# Patient Record
Sex: Male | Born: 1955 | Race: White | Hispanic: No | Marital: Single | Smoking: Never smoker
Health system: Southern US, Community
[De-identification: ages and names within clinical notes are randomized; demographics above are authoritative.]

## PROBLEM LIST (undated history)

## (undated) DIAGNOSIS — R0683 Snoring: Secondary | ICD-10-CM

## (undated) DIAGNOSIS — M1990 Unspecified osteoarthritis, unspecified site: Secondary | ICD-10-CM

## (undated) DIAGNOSIS — IMO0002 Reserved for concepts with insufficient information to code with codable children: Secondary | ICD-10-CM

## (undated) DIAGNOSIS — M419 Scoliosis, unspecified: Secondary | ICD-10-CM

## (undated) DIAGNOSIS — F909 Attention-deficit hyperactivity disorder, unspecified type: Secondary | ICD-10-CM

## (undated) DIAGNOSIS — C801 Malignant (primary) neoplasm, unspecified: Secondary | ICD-10-CM

## (undated) HISTORY — PX: OTHER SURGICAL HISTORY: SHX169

## (undated) HISTORY — PX: COSMETIC SURGERY: SHX468

## (undated) HISTORY — DX: Snoring: R06.83

## (undated) HISTORY — PX: MOHS SURGERY: SUR867

---

## 1987-08-15 HISTORY — PX: TMJ ARTHROPLASTY: SHX1066

## 2000-08-14 HISTORY — PX: OTHER SURGICAL HISTORY: SHX169

## 2000-10-24 ENCOUNTER — Encounter: Payer: Self-pay | Admitting: Orthopedic Surgery

## 2000-10-24 ENCOUNTER — Ambulatory Visit (HOSPITAL_COMMUNITY): Admission: RE | Admit: 2000-10-24 | Discharge: 2000-10-24 | Payer: Self-pay | Admitting: Orthopedic Surgery

## 2004-12-31 ENCOUNTER — Emergency Department (HOSPITAL_COMMUNITY): Admission: EM | Admit: 2004-12-31 | Discharge: 2004-12-31 | Payer: Self-pay | Admitting: Emergency Medicine

## 2011-08-26 ENCOUNTER — Emergency Department (INDEPENDENT_AMBULATORY_CARE_PROVIDER_SITE_OTHER): Payer: 59

## 2011-08-26 ENCOUNTER — Encounter (HOSPITAL_BASED_OUTPATIENT_CLINIC_OR_DEPARTMENT_OTHER): Payer: Self-pay | Admitting: *Deleted

## 2011-08-26 ENCOUNTER — Emergency Department (HOSPITAL_BASED_OUTPATIENT_CLINIC_OR_DEPARTMENT_OTHER)
Admission: EM | Admit: 2011-08-26 | Discharge: 2011-08-26 | Disposition: A | Discharge status: Home / Self Care | Payer: 59 | Attending: Emergency Medicine | Admitting: Emergency Medicine

## 2011-08-26 DIAGNOSIS — S52123A Displaced fracture of head of unspecified radius, initial encounter for closed fracture: Secondary | ICD-10-CM

## 2011-08-26 DIAGNOSIS — S52122A Displaced fracture of head of left radius, initial encounter for closed fracture: Secondary | ICD-10-CM

## 2011-08-26 DIAGNOSIS — W19XXXA Unspecified fall, initial encounter: Secondary | ICD-10-CM

## 2011-08-26 DIAGNOSIS — IMO0002 Reserved for concepts with insufficient information to code with codable children: Secondary | ICD-10-CM | POA: Insufficient documentation

## 2011-08-26 HISTORY — DX: Reserved for concepts with insufficient information to code with codable children: IMO0002

## 2011-08-26 MED ORDER — HYDROCODONE-ACETAMINOPHEN 5-325 MG PO TABS: 2.0000 | ORAL_TABLET | ORAL | Status: AC | PRN

## 2011-08-26 MED ORDER — HYDROCODONE-ACETAMINOPHEN 5-325 MG PO TABS
2.0000 | ORAL_TABLET | Freq: Once | ORAL | Status: AC
  Administered 2011-08-26: 2 via ORAL
  Filled 2011-08-26: qty 2
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
## 2011-08-26 NOTE — ED Provider Notes (Signed)
History     CSN: [id number]  Arrival date & time 08/26/11  1415   None     Chief Complaint  Patient presents with  . Arm Injury    (Consider location/radiation/quality/duration/timing/severity/associated sxs/prior treatment) HPI  Past Medical History  Diagnosis Date  . DDD (degenerative disc disease)     Past Surgical History  Procedure Date  . Tmj arthroplasty   . Knee surgey   . Mohs surgery     No family history on file.  History  Substance Use Topics  . Smoking status: Never Smoker   . Smokeless tobacco: Never Used  . Alcohol Use: 1.8 oz/week    3 Cans of beer per week      Review of Systems  Allergies  Review of patient's allergies indicates no known allergies.  Home Medications   Current Outpatient Rx  Name Route Sig Dispense Refill  . OVER THE COUNTER MEDICATION Both Eyes Place 1 drop into both eyes 3 (three) times daily as needed. Eye drop for irritation    . GUMMI BEAR MULTIVITAMIN/MIN PO Oral Take 2 each by mouth daily.      BP 124/89  Pulse 72  Temp 98.4 F (36.9 C)  Resp 20  Ht 5\' 8"  (1.727 m)  Wt 175 lb (79.379 kg)  BMI 26.61 kg/m2  SpO2 97%  Physical Exam  ED Course  Procedures (including critical care time)  Labs Reviewed - No data to display No results found.   No diagnosis found.    MDM  Duplicate note        Doug Sou, MD 08/27/11 [phone]

## 2012-03-28 ENCOUNTER — Encounter: Payer: Self-pay | Admitting: Internal Medicine

## 2012-05-07 ENCOUNTER — Encounter: Payer: Self-pay | Admitting: Internal Medicine

## 2012-05-07 ENCOUNTER — Ambulatory Visit (AMBULATORY_SURGERY_CENTER): Payer: 59

## 2012-05-07 VITALS — Ht 67.5 in | Wt 185.7 lb

## 2012-05-07 DIAGNOSIS — Z800 Family history of malignant neoplasm of digestive organs: Secondary | ICD-10-CM

## 2012-05-07 DIAGNOSIS — Z1211 Encounter for screening for malignant neoplasm of colon: Secondary | ICD-10-CM

## 2012-05-07 MED ORDER — MOVIPREP 100 G PO SOLR: ORAL | Status: DC

## 2012-05-21 ENCOUNTER — Encounter: Payer: 59 | Admitting: Internal Medicine

## 2012-06-04 ENCOUNTER — Ambulatory Visit (AMBULATORY_SURGERY_CENTER): Payer: 59 | Admitting: Internal Medicine

## 2012-06-04 ENCOUNTER — Encounter: Payer: Self-pay | Admitting: Internal Medicine

## 2012-06-04 VITALS — BP 102/67 | HR 59 | Temp 96.6°F | Resp 20 | Ht 67.5 in | Wt 185.0 lb

## 2012-06-04 DIAGNOSIS — D126 Benign neoplasm of colon, unspecified: Secondary | ICD-10-CM

## 2012-06-04 DIAGNOSIS — Z1211 Encounter for screening for malignant neoplasm of colon: Secondary | ICD-10-CM

## 2012-06-04 DIAGNOSIS — Z800 Family history of malignant neoplasm of digestive organs: Secondary | ICD-10-CM

## 2012-06-04 MED ORDER — SODIUM CHLORIDE 0.9 % IV SOLN: 500.0000 mL | INTRAVENOUS | Status: DC

## 2012-06-04 NOTE — Op Note (Signed)
[facility] [address] [place], [address]   COLONOSCOPY PROCEDURE REPORT  PATIENT: Mitchell Beasley, Mitchell Beasley  MR#: [id number] BIRTHDATE: Apr 08, 1956 , 56  yrs. old GENDER: Male ENDOSCOPIST: Roxy Cedar, MD REFERRED [id number] Avva, M.D. PROCEDURE DATE:  06/04/2012 PROCEDURE:   Colonoscopy with snare polypectomy    x 1 ASA CLASS:   Class I INDICATIONS:average risk screening (grandparent CRC 90). MEDICATIONS: MAC sedation, administered by CRNA and propofol (Diprivan) 220mg  IV  DESCRIPTION OF PROCEDURE:   After the risks benefits and alternatives of the procedure were thoroughly explained, informed consent was obtained.  A digital rectal exam revealed no abnormalities of the rectum.   The LB CF-[id number]  endoscope was introduced through the anus and advanced to the cecum, which was identified by both the appendix and ileocecal valve. No adverse events experienced.   The quality of the prep was good, using MoviPrep  The instrument was then slowly withdrawn as the colon was fully examined.      COLON FINDINGS: A single polyp measuring 5 mm in size was found at the cecum.  A polypectomy was performed with a cold snare.  The resection was complete and the polyp tissue was completely retrieved.   The colon was otherwise normal.  There was no diverticulosis, inflammation, other polyps or cancers . Retroflexed views revealed small internal hemorrhoids. The time to cecum=2 minutes 57 seconds.  Withdrawal time=14 minutes 16 seconds. The scope was withdrawn and the procedure completed. COMPLICATIONS: There were no complications.  ENDOSCOPIC IMPRESSION: 1.   Single polyp measuring 5 mm in size was found at the cecum; polypectomy was performed with a cold snare 2.   The colon was otherwise normal  RECOMMENDATIONS: 1. Repeat colonoscopy in 5 years if polyp adenomatous; otherwise 10 years   eSigned:  Roxy Cedar, MD 06/04/2012 10:24 AM   cc: Chilton Greathouse, MD and Jarome Matin, MD

## 2012-06-04 NOTE — Patient Instructions (Addendum)
Handout was given to your care partner on polyps.  You may resume your current medications today.  Please call if any questions or concerns.    YOU HAD AN ENDOSCOPIC PROCEDURE TODAY AT THE [facility]: Refer to the procedure report that was given to you for any specific questions about what was found during the examination.  If the procedure report does not answer your questions, please call your gastroenterologist to clarify.  If you requested that your care partner not be given the details of your procedure findings, then the procedure report has been included in a sealed envelope for you to review at your convenience later.  YOU SHOULD EXPECT: Some feelings of bloating in the abdomen. Passage of more gas than usual.  Walking can help get rid of the air that was put into your GI tract during the procedure and reduce the bloating. If you had a lower endoscopy (such as a colonoscopy or flexible sigmoidoscopy) you may notice spotting of blood in your stool or on the toilet paper. If you underwent a bowel prep for your procedure, then you may not have a normal bowel movement for a few days.  DIET: Your first meal following the procedure should be a light meal and then it is ok to progress to your normal diet.  A half-sandwich or bowl of soup is an example of a good first meal.  Heavy or fried foods are harder to digest and may make you feel nauseous or bloated.  Likewise meals heavy in dairy and vegetables can cause extra gas to form and this can also increase the bloating.  Drink plenty of fluids but you should avoid alcoholic beverages for 24 hours.  ACTIVITY: Your care partner should take you home directly after the procedure.  You should plan to take it easy, moving slowly for the rest of the day.  You can resume normal activity the day after the procedure however you should NOT DRIVE or use heavy machinery for 24 hours (because of the sedation medicines used during the test).    SYMPTOMS  TO REPORT IMMEDIATELY: A gastroenterologist can be reached at any hour.  During normal business hours, 8:30 AM to 5:00 PM Monday through Friday, call [phone].  After hours and on weekends, please call the GI answering service at [phone] who will take a message and have the physician on call contact you.   Following lower endoscopy (colonoscopy or flexible sigmoidoscopy):  Excessive amounts of blood in the stool  Significant tenderness or worsening of abdominal pains  Swelling of the abdomen that is new, acute  Fever of 100F or higher    FOLLOW UP: If any biopsies were taken you will be contacted by phone or by letter within the next 1-3 weeks.  Call your gastroenterologist if you have not heard about the biopsies in 3 weeks.  Our staff will call the home number listed on your records the next business day following your procedure to check on you and address any questions or concerns that you may have at that time regarding the information given to you following your procedure. This is a courtesy call and so if there is no answer at the home number and we have not heard from you through the emergency physician on call, we will assume that you have returned to your regular daily activities without incident.  SIGNATURES/CONFIDENTIALITY: You and/or your care partner have signed paperwork which will be entered into your electronic medical record.    These signatures attest to the fact that that the information above on your After Visit Summary has been reviewed and is understood.  Full responsibility of the confidentiality of this discharge information lies with you and/or your care-partner. 
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
## 2012-06-05 ENCOUNTER — Telehealth: Payer: Self-pay | Admitting: *Deleted

## 2012-06-05 NOTE — Telephone Encounter (Signed)
Left message on number given in admitting yesterday as directed per pt. ewm 

## 2012-06-10 ENCOUNTER — Encounter: Payer: Self-pay | Admitting: Internal Medicine

## 2012-11-19 ENCOUNTER — Other Ambulatory Visit: Payer: Self-pay | Admitting: Dermatology

## 2012-12-10 ENCOUNTER — Other Ambulatory Visit: Payer: Self-pay | Admitting: Dermatology

## 2013-06-03 ENCOUNTER — Other Ambulatory Visit: Payer: Self-pay | Admitting: Dermatology

## 2013-08-01 ENCOUNTER — Other Ambulatory Visit (HOSPITAL_COMMUNITY): Payer: Self-pay | Admitting: Unknown Physician Specialty

## 2013-08-01 DIAGNOSIS — H539 Unspecified visual disturbance: Secondary | ICD-10-CM

## 2014-07-13 ENCOUNTER — Other Ambulatory Visit: Payer: Self-pay | Admitting: Internal Medicine

## 2014-07-13 DIAGNOSIS — M419 Scoliosis, unspecified: Secondary | ICD-10-CM

## 2014-07-13 DIAGNOSIS — M48061 Spinal stenosis, lumbar region without neurogenic claudication: Secondary | ICD-10-CM

## 2014-07-14 ENCOUNTER — Ambulatory Visit
Admission: RE | Admit: 2014-07-14 | Discharge: 2014-07-14 | Disposition: A | Discharge status: Home / Self Care | Payer: BC Managed Care – PPO | Source: Ambulatory Visit | Attending: Internal Medicine | Admitting: Internal Medicine

## 2014-07-14 DIAGNOSIS — M419 Scoliosis, unspecified: Secondary | ICD-10-CM

## 2014-07-14 DIAGNOSIS — M48061 Spinal stenosis, lumbar region without neurogenic claudication: Secondary | ICD-10-CM

## 2014-07-21 ENCOUNTER — Other Ambulatory Visit: Payer: 59

## 2014-10-13 DIAGNOSIS — M4120 Other idiopathic scoliosis, site unspecified: Secondary | ICD-10-CM | POA: Insufficient documentation

## 2014-12-08 ENCOUNTER — Other Ambulatory Visit: Payer: Self-pay | Admitting: Dermatology

## 2015-03-15 ENCOUNTER — Other Ambulatory Visit: Payer: Self-pay | Admitting: Internal Medicine

## 2015-03-15 DIAGNOSIS — M25552 Pain in left hip: Secondary | ICD-10-CM

## 2015-04-06 ENCOUNTER — Other Ambulatory Visit: Payer: Self-pay

## 2015-04-06 ENCOUNTER — Ambulatory Visit
Admission: RE | Admit: 2015-04-06 | Discharge: 2015-04-06 | Disposition: A | Discharge status: Home / Self Care | Payer: BLUE CROSS/BLUE SHIELD | Source: Ambulatory Visit | Attending: Internal Medicine | Admitting: Internal Medicine

## 2015-04-06 DIAGNOSIS — M25552 Pain in left hip: Secondary | ICD-10-CM

## 2015-04-06 MED ORDER — METHYLPREDNISOLONE ACETATE 40 MG/ML INJ SUSP (RADIOLOG
120.0000 mg | Freq: Once | INTRAMUSCULAR | Status: AC
  Administered 2015-04-06: 120 mg via INTRA_ARTICULAR

## 2015-04-06 MED ORDER — IOHEXOL 180 MG/ML  SOLN
1.0000 mL | Freq: Once | INTRAMUSCULAR | Status: DC | PRN
  Administered 2015-04-06: 1 mL via INTRA_ARTICULAR

## 2015-04-06 NOTE — Discharge Instructions (Signed)

## 2015-06-15 DIAGNOSIS — C801 Malignant (primary) neoplasm, unspecified: Secondary | ICD-10-CM

## 2015-06-15 HISTORY — DX: Malignant (primary) neoplasm, unspecified: C80.1

## 2015-06-25 ENCOUNTER — Other Ambulatory Visit: Payer: Self-pay | Admitting: Internal Medicine

## 2015-06-25 DIAGNOSIS — M25552 Pain in left hip: Secondary | ICD-10-CM

## 2015-06-30 ENCOUNTER — Other Ambulatory Visit: Payer: BLUE CROSS/BLUE SHIELD

## 2015-07-06 ENCOUNTER — Ambulatory Visit
Admission: RE | Admit: 2015-07-06 | Discharge: 2015-07-06 | Disposition: A | Discharge status: Home / Self Care | Payer: BLUE CROSS/BLUE SHIELD | Source: Ambulatory Visit | Attending: Internal Medicine | Admitting: Internal Medicine

## 2015-07-06 DIAGNOSIS — M25552 Pain in left hip: Secondary | ICD-10-CM

## 2015-07-06 MED ORDER — IOHEXOL 180 MG/ML  SOLN
1.0000 mL | Freq: Once | INTRAMUSCULAR | Status: DC | PRN
  Administered 2015-07-06: 1 mL via INTRA_ARTICULAR

## 2015-07-06 MED ORDER — METHYLPREDNISOLONE ACETATE 40 MG/ML INJ SUSP (RADIOLOG
120.0000 mg | Freq: Once | INTRAMUSCULAR | Status: AC
  Administered 2015-07-06: 120 mg via INTRA_ARTICULAR

## 2015-07-15 ENCOUNTER — Ambulatory Visit: Payer: Self-pay | Admitting: Orthopedic Surgery

## 2015-07-15 NOTE — Progress Notes (Signed)
Preoperative surgical orders have been place into the Epic hospital system for Mitchell Beasley on 07/15/2015, 12:19 PM  by Mickel Crow for surgery on 08-18-2015.  Preop Total Hip - Anterior Approach orders including IV Tylenol, and IV Decadron as long as there are no contraindications to the above medications. Arlee Muslim, PA-C

## 2015-08-03 ENCOUNTER — Other Ambulatory Visit (HOSPITAL_COMMUNITY): Payer: Self-pay | Admitting: *Deleted

## 2015-08-03 NOTE — Patient Instructions (Signed)
Mitchell Beasley  08/03/2015   Your procedure is scheduled on: 08-17-14  Report to [facility]  Entrance take [facility]  elevators to 3rd floor to  Mitchell Beasley at 630 AM.  Call this number if you have problems the morning of surgery [phone]   Remember: ONLY 1 PERSON MAY GO WITH YOU TO SHORT STAY TO GET  READY MORNING OF [name].  Do not eat food or drink liquids :After Midnight.     Take these medicines the morning of surgery with A SIP OF WATER: Eyedrop if needed              You may not have any metal on your body including hair pins and              piercings  Do not wear jewelry, make-up, lotions, powders or perfumes, deodorant             Do not wear nail polish.  Do not shave  48 hours prior to surgery.              Men may shave face and neck.   Do not bring valuables to the hospital. [place].  Contacts, dentures or bridgework may not be worn into surgery.  Leave suitcase in the car. After surgery it may be brought to your room.     Patients discharged the day of surgery will not be allowed to drive home.  Name and phone number of your driver:  Special Instructions: N/A              Please read over the following fact sheets you were given: _____________________________________________________________________             [name] - Preparing for Surgery Before surgery, you can play an important role.  Because skin is not sterile, your skin needs to be as free of germs as possible.  You can reduce the number of germs on your skin by washing with CHG (chlorahexidine gluconate) soap before surgery.  CHG is an antiseptic cleaner which kills germs and bonds with the skin to continue killing germs even after washing. Please DO NOT use if you have an allergy to CHG or antibacterial soaps.  If your skin becomes reddened/irritated stop using the CHG and inform your nurse when you arrive at  Short Stay. Do not shave (including legs and underarms) for at least 48 hours prior to the first CHG shower.  You may shave your face/neck. Please follow these instructions carefully:  1.  Shower with CHG Soap the night before surgery and the  morning of Surgery.  2.  If you choose to wash your hair, wash your hair first as usual with your  normal  shampoo.  3.  After you shampoo, rinse your hair and body thoroughly to remove the  shampoo.                           4.  Use CHG as you would any other liquid soap.  You can apply chg directly  to the skin and wash                       Gently with a scrungie or clean  washcloth.  5.  Apply the CHG Soap to your body ONLY FROM THE NECK DOWN.   Do not use on face/ open                           Wound or open sores. Avoid contact with eyes, ears mouth and genitals (private parts).                       Wash face,  Genitals (private parts) with your normal soap.             6.  Wash thoroughly, paying special attention to the area where your surgery  will be performed.  7.  Thoroughly rinse your body with warm water from the neck down.  8.  DO NOT shower/wash with your normal soap after using and rinsing off  the CHG Soap.                9.  Pat yourself dry with a clean towel.            10.  Wear clean pajamas.            11.  Place clean sheets on your bed the night of your first shower and do not  sleep with pets. Day of Surgery : Do not apply any lotions/deodorants the morning of surgery.  Please wear clean clothes to the hospital/surgery center.  FAILURE TO FOLLOW THESE INSTRUCTIONS MAY RESULT IN THE CANCELLATION OF YOUR SURGERY PATIENT SIGNATURE_________________________________  NURSE SIGNATURE__________________________________  ________________________________________________________________________   Mitchell Beasley  An incentive spirometer is a tool that can help keep your lungs clear and active. This tool measures how well you are  filling your lungs with each breath. Taking long deep breaths may help reverse or decrease the chance of developing breathing (pulmonary) problems (especially infection) following:  A long period of time when you are unable to move or be active. BEFORE THE PROCEDURE   If the spirometer includes an indicator to show your best effort, your nurse or respiratory therapist will set it to a desired goal.  If possible, sit up straight or lean slightly forward. Try not to slouch.  Hold the incentive spirometer in an upright position. INSTRUCTIONS FOR USE   Sit on the edge of your bed if possible, or sit up as far as you can in bed or on a chair.  Hold the incentive spirometer in an upright position.  Breathe out normally.  Place the mouthpiece in your mouth and seal your lips tightly around it.  Breathe in slowly and as deeply as possible, raising the piston or the ball toward the top of the column.  Hold your breath for 3-5 seconds or for as long as possible. Allow the piston or ball to fall to the bottom of the column.  Remove the mouthpiece from your mouth and breathe out normally.  Rest for a few seconds and repeat Steps 1 through 7 at least 10 times every 1-2 hours when you are awake. Take your time and take a few normal breaths between deep breaths.  The spirometer may include an indicator to show your best effort. Use the indicator as a goal to work toward during each repetition.  After each set of 10 deep breaths, practice coughing to be sure your lungs are clear. If you have an incision (the cut made at the time of surgery), support your incision when coughing by  placing a pillow or rolled up towels firmly against it. Once you are able to get out of bed, walk around indoors and cough well. You may stop using the incentive spirometer when instructed by your caregiver.  RISKS AND COMPLICATIONS  Take your time so you do not get dizzy or light-headed.  If you are in pain, you may  need to take or ask for pain medication before doing incentive spirometry. It is harder to take a deep breath if you are having pain. AFTER USE  Rest and breathe slowly and easily.  It can be helpful to keep track of a log of your progress. Your caregiver can provide you with a simple table to help with this. If you are using the spirometer at home, follow these instructions: [place] IF:   You are having difficultly using the spirometer.  You have trouble using the spirometer as often as instructed.  Your pain medication is not giving enough relief while using the spirometer.  You develop fever of 100.5 F (38.1 C) or higher. SEEK IMMEDIATE MEDICAL CARE IF:   You cough up bloody sputum that had not been present before.  You develop fever of 102 F (38.9 C) or greater.  You develop worsening pain at or near the incision site. MAKE SURE YOU:   Understand these instructions.  Will watch your condition.  Will get help right away if you are not doing well or get worse. Document Released: 12/11/2006 Document Revised: 10/23/2011 Document Reviewed: 02/11/2007 ExitCare Patient Information 2014 ExitCare, [place].   ________________________________________________________________________  WHAT IS A BLOOD TRANSFUSION? Blood Transfusion Information  A transfusion is the replacement of blood or some of its parts. Blood is made up of multiple cells which provide different functions.  Red blood cells carry oxygen and are used for blood loss replacement.  White blood cells fight against infection.  Platelets control bleeding.  Plasma helps clot blood.  Other blood products are available for specialized needs, such as hemophilia or other clotting disorders. BEFORE THE TRANSFUSION  Who gives blood for transfusions?   Healthy volunteers who are fully evaluated to make sure their blood is safe. This is blood bank blood. Transfusion therapy is the safest it has ever been in  the practice of medicine. Before blood is taken from a donor, a complete history is taken to make sure that person has no history of diseases nor engages in risky social behavior (examples are intravenous drug use or sexual activity with multiple partners). The donor's travel history is screened to minimize risk of transmitting infections, such as malaria. The donated blood is tested for signs of infectious diseases, such as HIV and hepatitis. The blood is then tested to be sure it is compatible with you in order to minimize the chance of a transfusion reaction. If you or a relative donates blood, this is often done in anticipation of surgery and is not appropriate for emergency situations. It takes many days to process the donated blood. RISKS AND COMPLICATIONS Although transfusion therapy is very safe and saves many lives, the main dangers of transfusion include:   Getting an infectious disease.  Developing a transfusion reaction. This is an allergic reaction to something in the blood you were given. Every precaution is taken to prevent this. The decision to have a blood transfusion has been considered carefully by your caregiver before blood is given. Blood is not given unless the benefits outweigh the risks. AFTER THE TRANSFUSION  Right after receiving a blood  transfusion, you will usually feel much better and more energetic. This is especially true if your red blood cells have gotten low (anemic). The transfusion raises the level of the red blood cells which carry oxygen, and this usually causes an energy increase.  The nurse administering the transfusion will monitor you carefully for complications. HOME CARE INSTRUCTIONS  No special instructions are needed after a transfusion. You may find your energy is better. Speak with your caregiver about any limitations on activity for underlying diseases you may have. SEEK MEDICAL CARE IF:   Your condition is not improving after your  transfusion.  You develop redness or irritation at the intravenous (IV) site. SEEK IMMEDIATE MEDICAL CARE IF:  Any of the following symptoms occur over the next 12 hours:  Shaking chills.  You have a temperature by mouth above 102 F (38.9 C), not controlled by medicine.  Chest, back, or muscle pain.  People around you feel you are not acting correctly or are confused.  Shortness of breath or difficulty breathing.  Dizziness and fainting.  You get a rash or develop hives.  You have a decrease in urine output.  Your urine turns a dark color or changes to pink, red, or brown. Any of the following symptoms occur over the next 10 days:  You have a temperature by mouth above 102 F (38.9 C), not controlled by medicine.  Shortness of breath.  Weakness after normal activity.  The white part of the eye turns yellow (jaundice).  You have a decrease in the amount of urine or are urinating less often.  Your urine turns a dark color or changes to pink, red, or brown. Document Released: 07/28/2000 Document Revised: 10/23/2011 Document Reviewed: 03/16/2008 [facility] Patient Information 2014 [place], [place].  _______________________________________________________________________

## 2015-08-03 NOTE — Progress Notes (Signed)
Medical clearance note dr Dagmar Hait 07-08-15 on chart

## 2015-08-10 ENCOUNTER — Encounter (HOSPITAL_COMMUNITY)
Admission: RE | Admit: 2015-08-10 | Discharge: 2015-08-10 | Disposition: A | Discharge status: Home / Self Care | Payer: BLUE CROSS/BLUE SHIELD | Source: Ambulatory Visit | Attending: Orthopedic Surgery | Admitting: Orthopedic Surgery

## 2015-08-10 ENCOUNTER — Encounter (HOSPITAL_COMMUNITY): Payer: Self-pay

## 2015-08-10 DIAGNOSIS — Z01818 Encounter for other preprocedural examination: Secondary | ICD-10-CM | POA: Insufficient documentation

## 2015-08-10 DIAGNOSIS — M1612 Unilateral primary osteoarthritis, left hip: Secondary | ICD-10-CM | POA: Insufficient documentation

## 2015-08-10 HISTORY — DX: Scoliosis, unspecified: M41.9

## 2015-08-10 HISTORY — DX: Unspecified osteoarthritis, unspecified site: M19.90

## 2015-08-10 HISTORY — DX: Attention-deficit hyperactivity disorder, unspecified type: F90.9

## 2015-08-10 HISTORY — DX: Malignant (primary) neoplasm, unspecified: C80.1

## 2015-08-10 LAB — COMPREHENSIVE METABOLIC PANEL
ALBUMIN: 4.1 g/dL (ref 3.5–5.0)
ALT: 29 U/L (ref 17–63)
ANION GAP: 8 (ref 5–15)
AST: 33 U/L (ref 15–41)
Alkaline Phosphatase: 50 U/L (ref 38–126)
BILIRUBIN TOTAL: 0.8 mg/dL (ref 0.3–1.2)
BUN: 19 mg/dL (ref 6–20)
CHLORIDE: 105 mmol/L (ref 101–111)
CO2: 29 mmol/L (ref 22–32)
Calcium: 9 mg/dL (ref 8.9–10.3)
Creatinine, Ser: 0.94 mg/dL (ref 0.61–1.24)
GFR calc Af Amer: >60 mL/min (ref >60)
GFR calc non Af Amer: >60 mL/min (ref >60)
GLUCOSE: 96 mg/dL (ref 65–99)
POTASSIUM: 3.6 mmol/L (ref 3.5–5.1)
SODIUM: 142 mmol/L (ref 135–145)
TOTAL PROTEIN: 6.8 g/dL (ref 6.5–8.1)

## 2015-08-10 LAB — SURGICAL PCR SCREEN
MRSA, PCR: NEGATIVE
STAPHYLOCOCCUS AUREUS: POSITIVE — AB

## 2015-08-10 LAB — URINALYSIS, ROUTINE W REFLEX MICROSCOPIC
Bilirubin Urine: NEGATIVE
GLUCOSE, UA: NEGATIVE mg/dL
HGB URINE DIPSTICK: NEGATIVE
KETONES UR: NEGATIVE mg/dL
LEUKOCYTES UA: NEGATIVE
Nitrite: NEGATIVE
PH: 5.5 (ref 5.0–8.0)
PROTEIN: NEGATIVE mg/dL
Specific Gravity, Urine: 1.016 (ref 1.005–1.030)

## 2015-08-10 LAB — CBC
HEMATOCRIT: 42.2 % (ref 39.0–52.0)
Hemoglobin: 14.2 g/dL (ref 13.0–17.0)
MCH: 31.3 pg (ref 26.0–34.0)
MCHC: 33.6 g/dL (ref 30.0–36.0)
MCV: 93 fL (ref 78.0–100.0)
PLATELETS: 172 10*3/uL (ref 150–400)
RBC: 4.54 MIL/uL (ref 4.22–5.81)
RDW: 13.1 % (ref 11.5–15.5)
WBC: 7.3 10*3/uL (ref 4.0–10.5)

## 2015-08-10 LAB — PROTIME-INR
INR: 1 (ref 0.00–1.49)
Prothrombin Time: 13.4 seconds (ref 11.6–15.2)

## 2015-08-10 LAB — ABO/RH: ABO/RH(D): A NEG

## 2015-08-10 LAB — APTT: APTT: 28 s (ref 24–37)

## 2015-08-16 ENCOUNTER — Ambulatory Visit: Payer: Self-pay | Admitting: Orthopedic Surgery

## 2015-08-16 NOTE — H&P (Signed)
Mitchell Lopes MD DOB: 05/26/1956 Married / Language: English / Race: White Male Date of Admission:  08/18/2015 CC:  Left Hip Pain History of Present Illness The patient is a 60 year old male who comes in for a preoperative History and Physical. The patient is scheduled for a left total hip arthroplasty (anterior) to be performed by Dr. Dione Plover. [name], MD at [facility] on 08-18-2015. The patient is a 60 year old male who presented to the practice for transition of care. The patient reports left hip problems including pain (hip flexion and internal and upward rotation- especially when cycling and putting on shoes) and catching symptoms that have been present for 2 month(s). The patient does not report any radiation of symptoms. The patient describes the hip problem as dull and aching. Onset of symptoms was gradual. Current treatment includes application of heat (prior to stretching), restricted activity and nonsteroidal anti-inflammatory drugs. Risk factors include athletic activities (pt reports training for cycling event and is riding bike approximately 20 miles daily) and running. Mitchell Beasley states that he has developed this pain in his hip over the past several months. He has been training for a 200 mile bike ride and has noticed more limitations in motion of the hip as well as groin pain. He did not have much problem with the hip prior to this. He is very concerned because he is unable to do a lot of things he desires. He especially has problems now trying to play tennis. He has definitely noticed more limitations in motion in that left hip since this has started. He is not having any issues with the right hip at all. He has no family history of hip problems. He was found to have pretty significant arthritic change in the hip. His right hip looks normal. This appears to be an impingement-related phenomenon. He was asking about possible arthroscopic surgery, but I said there is too much arthritis  to consider that. It was felt that he would benefit from undergoing total hip replacement. They have been treated conservatively in the past for the above stated problem and despite conservative measures, they continue to have progressive pain and severe functional limitations and dysfunction. They have failed non-operative management including home exercise, medications, and injections. It is felt that they would benefit from undergoing total joint replacement. Risks and benefits of the procedure have been discussed with the patient and they elect to proceed with surgery. There are no active contraindications to surgery such as ongoing infection or rapidly progressive neurological disease.  Problem List/Past Medical  Primary osteoarthritis of left hip (M16.12)  Scoliosis (M41.9)  Degenerative Disc Disease   Allergies No Known Drug Allergies  Family History Heart disease in male family member before age 60  Heart Disease  father, grandfather mothers side and grandfather fathers side Cancer  grandmother mothers side Father  MI, CAD, ESRD Mother  Living. 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 Advil (200MG  Capsule, Oral) Active. Cyclobenzaprine HCl (10MG  Tablet, Oral) Active. Ambien (10MG  Tablet, Oral)  Active.  Past Surgical History Arthroscopy of Knee  Date: 2002. left; Dr. Carrington Clamp Surgery  Date: 1998. left - Morton's Neuroma Left TMJ Release  Date: 1989. Dental Implant  Date: 2014  Review of Systems  General Not Present- Chills, Fatigue, Fever, Memory Loss, Night Sweats, Weight Gain and Weight Loss. Skin Not Present- Eczema, Hives, Itching, Lesions and Rash. HEENT Not Present- Dentures, Double Vision, Headache, Hearing Loss, Tinnitus and Visual Loss. Respiratory Not Present- Allergies, Chronic Cough, Coughing up blood, Shortness of breath at rest and Shortness of breath with exertion. Cardiovascular Not Present- Chest Pain, Difficulty Breathing Lying Down, Murmur, Palpitations, Racing/skipping heartbeats and Swelling. Gastrointestinal Not Present- Abdominal Pain, Bloody Stool, Constipation, Diarrhea, Difficulty Swallowing, Heartburn, Jaundice, Loss of appetitie, Nausea and Vomiting. Male Genitourinary Not Present- Blood in Urine, Discharge, Flank Pain, Incontinence, Painful Urination, Urgency, Urinary frequency, Urinary Retention, Urinating at Night and Weak urinary stream. Musculoskeletal Present- Joint Pain. Not Present- Back Pain, Joint Swelling, Morning Stiffness, Muscle Pain, Muscle Weakness and Spasms. Neurological Not Present- Blackout spells, Difficulty with balance, Dizziness, Paralysis, Tremor and Weakness. Psychiatric Not Present- Insomnia.  Vitals  Weight: 175 lb Height: 69in Weight was reported by patient. Height was reported by patient. Body Surface Area: 1.95 m Body Mass Index: 25.84 kg/m    Physical Exam  General Mental Status -Alert, cooperative and good historian. General Appearance-pleasant, Not in acute distress. Orientation-Oriented X3. Build & Nutrition-Well nourished and Well developed.  Head and Neck Head-normocephalic, atraumatic . Neck Global Assessment - supple, no bruit auscultated on the right, no bruit auscultated on the  left.  Eye Vision-Wears corrective lenses. Pupil - Bilateral-Regular and Round. Motion - Bilateral-EOMI.  Chest and Lung Exam Auscultation Breath sounds - clear at anterior chest wall and clear at posterior chest wall. Adventitious sounds - No Adventitious sounds.  Cardiovascular Auscultation Rhythm - Regular rate and rhythm. Heart Sounds - S1 WNL and S2 WNL. Murmurs & Other Heart Sounds - Auscultation of the heart reveals - No Murmurs.  Abdomen Palpation/Percussion Tenderness - Abdomen is non-tender to palpation. Rigidity (guarding) - Abdomen is soft. Auscultation Auscultation of the abdomen reveals - Bowel sounds normal.  Male Genitourinary Note: Not done, not pertinent to present illness   Musculoskeletal Note: A well-developed male, alert, oriented, and in no apparent distress. Evaluation of his right hip shows normal range of motion with no discomfort. Evaluation of left hip shows flexion to 100. No internal rotation, only about 10 to 20 degrees of external rotation, and 20 degrees of abduction. His left knee exam is normal. Pulse, sensation, and motor is intact in left lower extremity.  RADIOGRAPHS AP pelvis and lateral left hip show impingement morphology of the hip with focal bone on bone change in the superolateral aspect of the hip and large marginal osteophytes superolateral and anteroinferior.  Assessment & Plan  Primary osteoarthritis of left hip (M16.12)  Note:Surgical Plans: Left Total Hip Replacement - Anterior Approach  Disposition: Home  PCP: Dr. Dagmar Hait - Patient has been seen preoperatively and felt to be stable for surgery.  IV TXA  Anesthesia Issues: None  Signed electronically by Joelene Millin, III PA-C

## 2015-08-18 ENCOUNTER — Inpatient Hospital Stay (HOSPITAL_COMMUNITY): Payer: BLUE CROSS/BLUE SHIELD

## 2015-08-18 ENCOUNTER — Inpatient Hospital Stay (HOSPITAL_COMMUNITY): Payer: BLUE CROSS/BLUE SHIELD | Admitting: Certified Registered"

## 2015-08-18 ENCOUNTER — Encounter (HOSPITAL_COMMUNITY)
Admission: RE | Disposition: A | Discharge status: Home Health | Payer: Self-pay | Source: Ambulatory Visit | Attending: Orthopedic Surgery

## 2015-08-18 ENCOUNTER — Encounter (HOSPITAL_COMMUNITY): Payer: Self-pay | Admitting: Anesthesiology

## 2015-08-18 ENCOUNTER — Inpatient Hospital Stay (HOSPITAL_COMMUNITY)
Admission: RE | Admit: 2015-08-18 | Discharge: 2015-08-19 | DRG: 470 | Disposition: A | Discharge status: Home Health | Payer: BLUE CROSS/BLUE SHIELD | Source: Ambulatory Visit | Attending: Orthopedic Surgery | Admitting: Orthopedic Surgery

## 2015-08-18 DIAGNOSIS — Z01812 Encounter for preprocedural laboratory examination: Secondary | ICD-10-CM | POA: Diagnosis not present

## 2015-08-18 DIAGNOSIS — Z79899 Other long term (current) drug therapy: Secondary | ICD-10-CM | POA: Diagnosis not present

## 2015-08-18 DIAGNOSIS — M25752 Osteophyte, left hip: Secondary | ICD-10-CM | POA: Diagnosis present

## 2015-08-18 DIAGNOSIS — F909 Attention-deficit hyperactivity disorder, unspecified type: Secondary | ICD-10-CM | POA: Diagnosis present

## 2015-08-18 DIAGNOSIS — Z96649 Presence of unspecified artificial hip joint: Secondary | ICD-10-CM

## 2015-08-18 DIAGNOSIS — M1612 Unilateral primary osteoarthritis, left hip: Principal | ICD-10-CM | POA: Diagnosis present

## 2015-08-18 DIAGNOSIS — Z791 Long term (current) use of non-steroidal anti-inflammatories (NSAID): Secondary | ICD-10-CM

## 2015-08-18 DIAGNOSIS — M419 Scoliosis, unspecified: Secondary | ICD-10-CM | POA: Diagnosis present

## 2015-08-18 DIAGNOSIS — Z8249 Family history of ischemic heart disease and other diseases of the circulatory system: Secondary | ICD-10-CM

## 2015-08-18 DIAGNOSIS — Z85828 Personal history of other malignant neoplasm of skin: Secondary | ICD-10-CM | POA: Diagnosis not present

## 2015-08-18 DIAGNOSIS — Z809 Family history of malignant neoplasm, unspecified: Secondary | ICD-10-CM | POA: Diagnosis not present

## 2015-08-18 DIAGNOSIS — M25552 Pain in left hip: Secondary | ICD-10-CM | POA: Diagnosis present

## 2015-08-18 DIAGNOSIS — M169 Osteoarthritis of hip, unspecified: Secondary | ICD-10-CM | POA: Diagnosis present

## 2015-08-18 HISTORY — PX: TOTAL HIP ARTHROPLASTY: SHX124

## 2015-08-18 LAB — TYPE AND SCREEN
ABO/RH(D): A NEG
ANTIBODY SCREEN: NEGATIVE

## 2015-08-18 SURGERY — ARTHROPLASTY, HIP, TOTAL, ANTERIOR APPROACH
Anesthesia: Spinal | Site: Hip | Laterality: Left

## 2015-08-18 MED ORDER — FENTANYL CITRATE (PF) 100 MCG/2ML IJ SOLN
INTRAMUSCULAR | Status: DC | PRN
  Administered 2015-08-18: 100 ug via INTRAVENOUS

## 2015-08-18 MED ORDER — DIPHENHYDRAMINE HCL 12.5 MG/5ML PO ELIX: 12.5000 mg | ORAL_SOLUTION | ORAL | Status: DC | PRN

## 2015-08-18 MED ORDER — ONDANSETRON HCL 4 MG/2ML IJ SOLN
INTRAMUSCULAR | Status: DC | PRN
  Administered 2015-08-18: 4 mg via INTRAVENOUS

## 2015-08-18 MED ORDER — MIDAZOLAM HCL 2 MG/2ML IJ SOLN
INTRAMUSCULAR | Status: AC
  Filled 2015-08-18: qty 2

## 2015-08-18 MED ORDER — CEFAZOLIN SODIUM-DEXTROSE 2-3 GM-% IV SOLR
2.0000 g | Freq: Four times a day (QID) | INTRAVENOUS | Status: AC
  Administered 2015-08-18 (×2): 2 g via INTRAVENOUS
  Filled 2015-08-18 (×2): qty 50

## 2015-08-18 MED ORDER — LACTATED RINGERS IV SOLN: INTRAVENOUS | Status: DC

## 2015-08-18 MED ORDER — BUPIVACAINE HCL (PF) 0.25 % IJ SOLN
INTRAMUSCULAR | Status: AC
  Filled 2015-08-18: qty 30

## 2015-08-18 MED ORDER — CYCLOBENZAPRINE HCL 10 MG PO TABS: 10.0000 mg | ORAL_TABLET | Freq: Three times a day (TID) | ORAL | Status: DC | PRN

## 2015-08-18 MED ORDER — METOCLOPRAMIDE HCL 5 MG/ML IJ SOLN: 5.0000 mg | Freq: Three times a day (TID) | INTRAMUSCULAR | Status: DC | PRN

## 2015-08-18 MED ORDER — HYDROMORPHONE HCL 1 MG/ML IJ SOLN
INTRAMUSCULAR | Status: AC
  Filled 2015-08-18: qty 1

## 2015-08-18 MED ORDER — OXYCODONE HCL 5 MG PO TABS
5.0000 mg | ORAL_TABLET | ORAL | Status: DC | PRN
  Administered 2015-08-18 – 2015-08-19 (×4): 10 mg via ORAL
  Filled 2015-08-18 (×4): qty 2

## 2015-08-18 MED ORDER — KETOTIFEN FUMARATE 0.025 % OP SOLN
1.0000 [drp] | Freq: Two times a day (BID) | OPHTHALMIC | Status: DC | PRN
  Filled 2015-08-18: qty 5

## 2015-08-18 MED ORDER — ACETAMINOPHEN 10 MG/ML IV SOLN
1000.0000 mg | Freq: Once | INTRAVENOUS | Status: AC
  Administered 2015-08-18: 1000 mg via INTRAVENOUS
  Filled 2015-08-18: qty 100

## 2015-08-18 MED ORDER — SODIUM CHLORIDE 0.9 % IV SOLN
1000.0000 mg | INTRAVENOUS | Status: AC
  Administered 2015-08-18: 1000 mg via INTRAVENOUS
  Filled 2015-08-18 (×2): qty 10

## 2015-08-18 MED ORDER — MENTHOL 3 MG MT LOZG: 1.0000 | LOZENGE | OROMUCOSAL | Status: DC | PRN

## 2015-08-18 MED ORDER — POLYETHYLENE GLYCOL 3350 17 G PO PACK: 17.0000 g | PACK | Freq: Every day | ORAL | Status: DC | PRN

## 2015-08-18 MED ORDER — KETOROLAC TROMETHAMINE 15 MG/ML IJ SOLN: 7.5000 mg | Freq: Four times a day (QID) | INTRAMUSCULAR | Status: AC | PRN

## 2015-08-18 MED ORDER — RIVAROXABAN 10 MG PO TABS
10.0000 mg | ORAL_TABLET | Freq: Every day | ORAL | Status: DC
  Administered 2015-08-19: 10 mg via ORAL
  Filled 2015-08-18 (×2): qty 1

## 2015-08-18 MED ORDER — PHENOL 1.4 % MT LIQD: 1.0000 | OROMUCOSAL | Status: DC | PRN

## 2015-08-18 MED ORDER — METHOCARBAMOL 500 MG PO TABS
500.0000 mg | ORAL_TABLET | Freq: Four times a day (QID) | ORAL | Status: DC | PRN
  Administered 2015-08-18: 500 mg via ORAL
  Filled 2015-08-18: qty 1

## 2015-08-18 MED ORDER — METHYLPHENIDATE HCL ER 10 MG PO TBCR: 40.0000 mg | EXTENDED_RELEASE_TABLET | Freq: Every day | ORAL | Status: DC

## 2015-08-18 MED ORDER — MIDAZOLAM HCL 5 MG/5ML IJ SOLN
INTRAMUSCULAR | Status: DC | PRN
  Administered 2015-08-18: 2 mg via INTRAVENOUS

## 2015-08-18 MED ORDER — HYDROMORPHONE HCL 1 MG/ML IJ SOLN
0.2500 mg | INTRAMUSCULAR | Status: DC | PRN
  Administered 2015-08-18: 0.5 mg via INTRAVENOUS

## 2015-08-18 MED ORDER — DEXAMETHASONE SODIUM PHOSPHATE 10 MG/ML IJ SOLN
10.0000 mg | Freq: Once | INTRAMUSCULAR | Status: DC
  Filled 2015-08-18: qty 1

## 2015-08-18 MED ORDER — PHENYLEPHRINE 40 MCG/ML (10ML) SYRINGE FOR IV PUSH (FOR BLOOD PRESSURE SUPPORT)
PREFILLED_SYRINGE | INTRAVENOUS | Status: AC
  Filled 2015-08-18: qty 10

## 2015-08-18 MED ORDER — MORPHINE SULFATE (PF) 2 MG/ML IV SOLN
1.0000 mg | INTRAVENOUS | Status: DC | PRN
Start: 2015-08-18 — End: 2015-08-19
  Filled 2015-08-18: qty 1

## 2015-08-18 MED ORDER — ONDANSETRON HCL 4 MG/2ML IJ SOLN
INTRAMUSCULAR | Status: AC
  Filled 2015-08-18: qty 2

## 2015-08-18 MED ORDER — LACTATED RINGERS IV SOLN
INTRAVENOUS | Status: DC | PRN
  Administered 2015-08-18 (×2): via INTRAVENOUS

## 2015-08-18 MED ORDER — PHENYLEPHRINE HCL 10 MG/ML IJ SOLN
INTRAMUSCULAR | Status: DC | PRN
  Administered 2015-08-18 (×10): 80 ug via INTRAVENOUS

## 2015-08-18 MED ORDER — ONDANSETRON HCL 4 MG PO TABS: 4.0000 mg | ORAL_TABLET | Freq: Four times a day (QID) | ORAL | Status: DC | PRN

## 2015-08-18 MED ORDER — TRAMADOL HCL 50 MG PO TABS: 50.0000 mg | ORAL_TABLET | Freq: Four times a day (QID) | ORAL | Status: DC | PRN

## 2015-08-18 MED ORDER — ZOLPIDEM TARTRATE 10 MG PO TABS: 10.0000 mg | ORAL_TABLET | Freq: Every evening | ORAL | Status: DC | PRN

## 2015-08-18 MED ORDER — METOCLOPRAMIDE HCL 10 MG PO TABS: 5.0000 mg | ORAL_TABLET | Freq: Three times a day (TID) | ORAL | Status: DC | PRN

## 2015-08-18 MED ORDER — FLEET ENEMA 7-19 GM/118ML RE ENEM: 1.0000 | ENEMA | Freq: Once | RECTAL | Status: DC | PRN

## 2015-08-18 MED ORDER — CEFAZOLIN SODIUM-DEXTROSE 2-3 GM-% IV SOLR
2.0000 g | INTRAVENOUS | Status: AC
  Administered 2015-08-18: 2 g via INTRAVENOUS

## 2015-08-18 MED ORDER — DOCUSATE SODIUM 100 MG PO CAPS
100.0000 mg | ORAL_CAPSULE | Freq: Two times a day (BID) | ORAL | Status: DC
  Administered 2015-08-18 (×2): 100 mg via ORAL

## 2015-08-18 MED ORDER — PROPOFOL 500 MG/50ML IV EMUL
INTRAVENOUS | Status: DC | PRN
  Administered 2015-08-18: 100 ug/kg/min via INTRAVENOUS

## 2015-08-18 MED ORDER — ACETAMINOPHEN 650 MG RE SUPP: 650.0000 mg | Freq: Four times a day (QID) | RECTAL | Status: DC | PRN

## 2015-08-18 MED ORDER — DEXAMETHASONE SODIUM PHOSPHATE 10 MG/ML IJ SOLN
10.0000 mg | Freq: Once | INTRAMUSCULAR | Status: AC
  Administered 2015-08-18: 10 mg via INTRAVENOUS

## 2015-08-18 MED ORDER — FENTANYL CITRATE (PF) 100 MCG/2ML IJ SOLN
INTRAMUSCULAR | Status: AC
  Filled 2015-08-18: qty 2

## 2015-08-18 MED ORDER — METHOCARBAMOL 1000 MG/10ML IJ SOLN
500.0000 mg | Freq: Four times a day (QID) | INTRAVENOUS | Status: DC | PRN
  Administered 2015-08-18: 500 mg via INTRAVENOUS
  Filled 2015-08-18 (×2): qty 5

## 2015-08-18 MED ORDER — PROPOFOL 10 MG/ML IV BOLUS
INTRAVENOUS | Status: AC
  Filled 2015-08-18: qty 40

## 2015-08-18 MED ORDER — PROMETHAZINE HCL 25 MG/ML IJ SOLN: 6.2500 mg | INTRAMUSCULAR | Status: DC | PRN

## 2015-08-18 MED ORDER — BUPIVACAINE HCL (PF) 0.25 % IJ SOLN
INTRAMUSCULAR | Status: DC | PRN
  Administered 2015-08-18: 30 mL

## 2015-08-18 MED ORDER — DEXAMETHASONE SODIUM PHOSPHATE 10 MG/ML IJ SOLN
INTRAMUSCULAR | Status: AC
  Filled 2015-08-18: qty 1

## 2015-08-18 MED ORDER — ACETAMINOPHEN 10 MG/ML IV SOLN
INTRAVENOUS | Status: AC
  Filled 2015-08-18: qty 100

## 2015-08-18 MED ORDER — CEFAZOLIN SODIUM-DEXTROSE 2-3 GM-% IV SOLR
INTRAVENOUS | Status: AC
  Filled 2015-08-18: qty 50

## 2015-08-18 MED ORDER — ACETAMINOPHEN 500 MG PO TABS
1000.0000 mg | ORAL_TABLET | Freq: Four times a day (QID) | ORAL | Status: AC
  Administered 2015-08-18 – 2015-08-19 (×4): 1000 mg via ORAL
  Filled 2015-08-18 (×4): qty 2

## 2015-08-18 MED ORDER — ACETAMINOPHEN 325 MG PO TABS: 650.0000 mg | ORAL_TABLET | Freq: Four times a day (QID) | ORAL | Status: DC | PRN

## 2015-08-18 MED ORDER — SODIUM CHLORIDE 0.9 % IV SOLN
INTRAVENOUS | Status: DC
Start: 2015-08-18 — End: 2015-08-18

## 2015-08-18 MED ORDER — ONDANSETRON HCL 4 MG/2ML IJ SOLN: 4.0000 mg | Freq: Four times a day (QID) | INTRAMUSCULAR | Status: DC | PRN

## 2015-08-18 MED ORDER — BISACODYL 10 MG RE SUPP: 10.0000 mg | Freq: Every day | RECTAL | Status: DC | PRN

## 2015-08-18 MED ORDER — KCL IN DEXTROSE-NACL 20-5-0.45 MEQ/L-%-% IV SOLN
INTRAVENOUS | Status: DC
  Administered 2015-08-18: 13:00:00 via INTRAVENOUS
  Filled 2015-08-18 (×3): qty 1000

## 2015-08-18 MED ORDER — BUPIVACAINE IN DEXTROSE 0.75-8.25 % IT SOLN
INTRATHECAL | Status: DC | PRN
  Administered 2015-08-18: 2 mL via INTRATHECAL

## 2015-08-18 SURGICAL SUPPLY — 35 items
BAG DECANTER FOR FLEXI CONT (MISCELLANEOUS) ×3 IMPLANT
BAG SPEC THK2 15X12 ZIP CLS (MISCELLANEOUS)
BAG ZIPLOCK 12X15 (MISCELLANEOUS) IMPLANT
BLADE SAG 18X100X1.27 (BLADE) ×3 IMPLANT
CAPT HIP TOTAL 2 ×2 IMPLANT
CLOSURE WOUND 1/2 X4 (GAUZE/BANDAGES/DRESSINGS) ×1
CLOTH BEACON ORANGE TIMEOUT ST (SAFETY) ×3 IMPLANT
COVER PERINEAL POST (MISCELLANEOUS) ×3 IMPLANT
DECANTER SPIKE VIAL GLASS SM (MISCELLANEOUS) ×3 IMPLANT
DRAPE STERI IOBAN 125X83 (DRAPES) ×3 IMPLANT
DRAPE U-SHAPE 47X51 STRL (DRAPES) ×6 IMPLANT
DRSG ADAPTIC 3X8 NADH LF (GAUZE/BANDAGES/DRESSINGS) ×3 IMPLANT
DRSG MEPILEX BORDER 4X4 (GAUZE/BANDAGES/DRESSINGS) ×3 IMPLANT
DRSG MEPILEX BORDER 4X8 (GAUZE/BANDAGES/DRESSINGS) ×3 IMPLANT
DURAPREP 26ML APPLICATOR (WOUND CARE) ×3 IMPLANT
ELECT REM PT RETURN 9FT ADLT (ELECTROSURGICAL) ×3
ELECTRODE REM PT RTRN 9FT ADLT (ELECTROSURGICAL) ×1 IMPLANT
EVACUATOR 1/8 PVC DRAIN (DRAIN) ×3 IMPLANT
GLOVE BIO SURGEON STRL SZ7.5 (GLOVE) ×3 IMPLANT
GLOVE BIO SURGEON STRL SZ8 (GLOVE) ×6 IMPLANT
GLOVE BIOGEL PI IND STRL 8 (GLOVE) ×2 IMPLANT
GLOVE BIOGEL PI INDICATOR 8 (GLOVE) ×4
GOWN STRL REUS W/TWL LRG LVL3 (GOWN DISPOSABLE) ×3 IMPLANT
GOWN STRL REUS W/TWL XL LVL3 (GOWN DISPOSABLE) ×3 IMPLANT
PACK ANTERIOR HIP CUSTOM (KITS) ×3 IMPLANT
STRIP CLOSURE SKIN 1/2X4 (GAUZE/BANDAGES/DRESSINGS) ×2 IMPLANT
SUT ETHIBOND NAB CT1 #1 30IN (SUTURE) ×3 IMPLANT
SUT MNCRL AB 4-0 PS2 18 (SUTURE) ×3 IMPLANT
SUT VIC AB 2-0 CT1 27 (SUTURE) ×6
SUT VIC AB 2-0 CT1 TAPERPNT 27 (SUTURE) ×2 IMPLANT
SUT VLOC 180 0 24IN GS25 (SUTURE) ×3 IMPLANT
SYR 50ML LL SCALE MARK (SYRINGE) IMPLANT
TRAY FOLEY W/METER SILVER 14FR (SET/KITS/TRAYS/PACK) ×1 IMPLANT
TRAY FOLEY W/METER SILVER 16FR (SET/KITS/TRAYS/PACK) ×3 IMPLANT
YANKAUER SUCT BULB TIP 10FT TU (MISCELLANEOUS) ×3 IMPLANT

## 2015-08-18 NOTE — H&P (View-Only) (Signed)
Mitchell Lopes MD DOB: [birth year] Married / Language: English / Race: White Male Date of Admission:  08/18/2015 CC:  Left Hip Pain History of Present Illness The patient is a 60 year old male who comes in for a preoperative History and Physical. The patient is scheduled for a left total hip arthroplasty (anterior) to be performed by Dr. Dione Plover. [name], MD at [facility] on 08-18-2015. The patient is a 60 year old male who presented to the practice for transition of care. The patient reports left hip problems including pain (hip flexion and internal and upward rotation- especially when cycling and putting on shoes) and catching symptoms that have been present for 2 month(s). The patient does not report any radiation of symptoms. The patient describes the hip problem as dull and aching. Onset of symptoms was gradual. Current treatment includes application of heat (prior to stretching), restricted activity and nonsteroidal anti-inflammatory drugs. Risk factors include athletic activities (pt reports training for cycling event and is riding bike approximately 20 miles daily) and running. Mitchell Beasley states that he has developed this pain in his hip over the past several months. He has been training for a 200 mile bike ride and has noticed more limitations in motion of the hip as well as groin pain. He did not have much problem with the hip prior to this. He is very concerned because he is unable to do a lot of things he desires. He especially has problems now trying to play tennis. He has definitely noticed more limitations in motion in that left hip since this has started. He is not having any issues with the right hip at all. He has no family history of hip problems. He was found to have pretty significant arthritic change in the hip. His right hip looks normal. This appears to be an impingement-related phenomenon. He was asking about possible arthroscopic surgery, but I said there is too much arthritis  to consider that. It was felt that he would benefit from undergoing total hip replacement. They have been treated conservatively in the past for the above stated problem and despite conservative measures, they continue to have progressive pain and severe functional limitations and dysfunction. They have failed non-operative management including home exercise, medications, and injections. It is felt that they would benefit from undergoing total joint replacement. Risks and benefits of the procedure have been discussed with the patient and they elect to proceed with surgery. There are no active contraindications to surgery such as ongoing infection or rapidly progressive neurological disease.  Problem List/Past Medical  Primary osteoarthritis of left hip (M16.12)  Scoliosis (M41.9)  Degenerative Disc Disease   Allergies No Known Drug Allergies  Family History Heart disease in male family member before age 39  Heart Disease  father, grandfather mothers side and grandfather fathers side Cancer  grandmother mothers side Father  MI, CAD, ESRD Mother  Living. 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 Advil (200MG  Capsule, Oral) Active. Cyclobenzaprine HCl (10MG  Tablet, Oral) Active. Ambien (10MG  Tablet, Oral)  Active.  Past Surgical History Arthroscopy of Knee  Date: 2002. left; Dr. Carrington Clamp Surgery  Date: 1998. left - Morton's Neuroma Left TMJ Release  Date: 1989. Dental Implant  Date: 2014  Review of Systems  General Not Present- Chills, Fatigue, Fever, Memory Loss, Night Sweats, Weight Gain and Weight Loss. Skin Not Present- Eczema, Hives, Itching, Lesions and Rash. HEENT Not Present- Dentures, Double Vision, Headache, Hearing Loss, Tinnitus and Visual Loss. Respiratory Not Present- Allergies, Chronic Cough, Coughing up blood, Shortness of breath at rest and Shortness of breath with exertion. Cardiovascular Not Present- Chest Pain, Difficulty Breathing Lying Down, Murmur, Palpitations, Racing/skipping heartbeats and Swelling. Gastrointestinal Not Present- Abdominal Pain, Bloody Stool, Constipation, Diarrhea, Difficulty Swallowing, Heartburn, Jaundice, Loss of appetitie, Nausea and Vomiting. Male Genitourinary Not Present- Blood in Urine, Discharge, Flank Pain, Incontinence, Painful Urination, Urgency, Urinary frequency, Urinary Retention, Urinating at Night and Weak urinary stream. Musculoskeletal Present- Joint Pain. Not Present- Back Pain, Joint Swelling, Morning Stiffness, Muscle Pain, Muscle Weakness and Spasms. Neurological Not Present- Blackout spells, Difficulty with balance, Dizziness, Paralysis, Tremor and Weakness. Psychiatric Not Present- Insomnia.  Vitals  Weight: 175 lb Height: 69in Weight was reported by patient. Height was reported by patient. Body Surface Area: 1.95 m Body Mass Index: 25.84 kg/m    Physical Exam  General Mental Status -Alert, cooperative and good historian. General Appearance-pleasant, Not in acute distress. Orientation-Oriented X3. Build & Nutrition-Well nourished and Well developed.  Head and Neck Head-normocephalic, atraumatic . Neck Global Assessment - supple, no bruit auscultated on the right, no bruit auscultated on the  left.  Eye Vision-Wears corrective lenses. Pupil - Bilateral-Regular and Round. Motion - Bilateral-EOMI.  Chest and Lung Exam Auscultation Breath sounds - clear at anterior chest wall and clear at posterior chest wall. Adventitious sounds - No Adventitious sounds.  Cardiovascular Auscultation Rhythm - Regular rate and rhythm. Heart Sounds - S1 WNL and S2 WNL. Murmurs & Other Heart Sounds - Auscultation of the heart reveals - No Murmurs.  Abdomen Palpation/Percussion Tenderness - Abdomen is non-tender to palpation. Rigidity (guarding) - Abdomen is soft. Auscultation Auscultation of the abdomen reveals - Bowel sounds normal.  Male Genitourinary Note: Not done, not pertinent to present illness   Musculoskeletal Note: A well-developed male, alert, oriented, and in no apparent distress. Evaluation of his right hip shows normal range of motion with no discomfort. Evaluation of left hip shows flexion to 100. No internal rotation, only about 10 to 20 degrees of external rotation, and 20 degrees of abduction. His left knee exam is normal. Pulse, sensation, and motor is intact in left lower extremity.  RADIOGRAPHS AP pelvis and lateral left hip show impingement morphology of the hip with focal bone on bone change in the superolateral aspect of the hip and large marginal osteophytes superolateral and anteroinferior.  Assessment & Plan  Primary osteoarthritis of left hip (M16.12)  Note:Surgical Plans: Left Total Hip Replacement - Anterior Approach  Disposition: Home  PCP: Dr. Dagmar Hait - Patient has been seen preoperatively and felt to be stable for surgery.  IV TXA  Anesthesia Issues: None  Signed electronically by Joelene Millin, III PA-C

## 2015-08-18 NOTE — Progress Notes (Signed)
Portable AP Pelvis X-ray done. 

## 2015-08-18 NOTE — Evaluation (Signed)
Physical Therapy Evaluation Patient Details Name: Mitchell Beasley MRN: [id number] DOB: [birth year] Today's Date: 08/18/2015   History of Present Illness  Pt s/p L THR  Clinical Impression  Pt s/p L THR presents with decreased L LE strength/ROM and post op pain limiting functional mobility.  Pt should progress to dc home with family assist and HHPT follow up.    Follow Up Recommendations Home health PT    Equipment Recommendations  Rolling walker with 5" wheels    Recommendations for Other Services OT consult     Precautions / Restrictions Precautions Precautions: Fall Restrictions Weight Bearing Restrictions: No Other Position/Activity Restrictions: WBAT      Mobility  Bed Mobility Overal bed mobility: Needs Assistance Bed Mobility: Supine to Sit     Supine to sit: Min assist     General bed mobility comments: cues for sequence and use of R LE to self assist  Transfers Overall transfer level: Needs assistance Equipment used: Rolling walker (2 wheeled) Transfers: Sit to/from Stand Sit to Stand: Min assist         General transfer comment: cues for LE management and use of UEs to self assist  Ambulation/Gait Ambulation/Gait assistance: Min assist Ambulation Distance (Feet): 200 Feet Assistive device: Rolling walker (2 wheeled) Gait Pattern/deviations: Step-to pattern;Step-through pattern;Decreased step length - right;Decreased step length - left;Shuffle;Trunk flexed Gait velocity: decr   General Gait Details: cues for posture, ER on L, position from RW and initial sequence  Stairs            Wheelchair Mobility    Modified Rankin (Stroke Patients Only)       Balance                                             Pertinent Vitals/Pain Pain Assessment: 0-10 Pain Score: 4  Pain Location: L hip/thigh Pain Descriptors / Indicators: Aching;Sore Pain Intervention(s): Limited activity within patient's tolerance;Monitored during  session;Premedicated before session;Ice applied    Home Living Family/patient expects to be discharged to:: Private residence Living Arrangements: Spouse/significant other Available Help at Discharge: Family Type of Home: House Home Access: Stairs to enter Entrance Stairs-Rails: Psychiatric nurse of Steps: 4 Home Layout: Two level;Able to live on main level with bedroom/bathroom;1/2 bath on main level Home Equipment: Cane - single point;Crutches      Prior Function Level of Independence: Independent               Hand Dominance        Extremity/Trunk Assessment   Upper Extremity Assessment: Overall WFL for tasks assessed           Lower Extremity Assessment: LLE deficits/detail   LLE Deficits / Details: Strength at hip 2+/5 with AAROM to 75 flex and 15 abd  Cervical / Trunk Assessment: Normal  Communication   Communication: No difficulties  Cognition Arousal/Alertness: Awake/alert Behavior During Therapy: WFL for tasks assessed/performed Overall Cognitive Status: Within Functional Limits for tasks assessed                      General Comments      Exercises Total Joint Exercises Ankle Circles/Pumps: AROM;Both;15 reps;Supine Quad Sets: AROM;Both;10 reps;Supine Heel Slides: AAROM;Left;15 reps;Supine Hip ABduction/ADduction: AAROM;Left;10 reps;Supine      Assessment/Plan    PT Assessment Patient needs continued PT services  PT Diagnosis Difficulty walking   PT  Problem List Decreased strength;Decreased range of motion;Decreased activity tolerance;Decreased mobility;Decreased knowledge of use of DME;Pain  PT Treatment Interventions DME instruction;Gait training;Stair training;Functional mobility training;Therapeutic activities;Therapeutic exercise;Patient/family education   PT Goals (Current goals can be found in the Care Plan section) Acute Rehab PT Goals Patient Stated Goal: Resume active lifestyle asap PT Goal Formulation:  With patient Time For Goal Achievement: 08/21/15 Potential to Achieve Goals: Good    Frequency 7X/week   Barriers to discharge        Co-evaluation               End of Session Equipment Utilized During Treatment: Gait belt Activity Tolerance: Patient tolerated treatment well Patient left: in chair;with call bell/phone within reach;with family/visitor present Nurse Communication: Mobility status         Time: [id number] PT Time Calculation (min) (ACUTE ONLY): 40 min   Charges:   PT Evaluation $PT Eval Low Complexity: 1 Procedure PT Treatments $Gait Training: 8-22 mins $Therapeutic Exercise: 8-22 mins   PT G Codes:        Mitchell Beasley August 20, 2015, 5:28 PM

## 2015-08-18 NOTE — Anesthesia Procedure Notes (Signed)
Spinal Patient location during procedure: OR Staffing Anesthesiologist: Franne Grip Performed by: anesthesiologist  Preanesthetic Checklist Completed: patient identified, site marked, surgical consent, pre-op evaluation, timeout performed, IV checked, risks and benefits discussed and monitors and equipment checked Spinal Block Patient position: sitting Prep: Betadine Patient monitoring: heart rate, continuous pulse ox and blood pressure Approach: midline Location: L3-4 Injection technique: single-shot Needle Needle type: Pencan  Needle gauge: 24 G Needle length: 9 cm Additional Notes Expiration date of kit checked and confirmed. Patient tolerated procedure well, without complications. CSF clear. No paresthesia. Meaningful verbal contact maintained throughout spinal placement.

## 2015-08-18 NOTE — Transfer of Care (Signed)
Immediate Anesthesia Transfer of Care Note  Patient: Mitchell Beasley  Procedure(s) Performed: Procedure(s): LEFT TOTAL HIP ARTHROPLASTY ANTERIOR APPROACH (Left)  Patient Location: PACU  Anesthesia Type:Spinal  Level of Consciousness: awake, alert , oriented and patient cooperative  Airway & Oxygen Therapy: Patient Spontanous Breathing and Patient connected to face mask oxygen  Post-op Assessment: Report given to RN and Post -op Vital signs reviewed and stable  Post vital signs: Reviewed and stable  Last Vitals:  Filed Vitals:   08/18/15 0642  BP: 141/88  Pulse: 80  Temp: 36.5 C  Resp: 18    Complications: No apparent anesthesia complications  T 10 spinal level

## 2015-08-18 NOTE — Progress Notes (Signed)
X-ray results noted 

## 2015-08-18 NOTE — Anesthesia Postprocedure Evaluation (Signed)
Anesthesia Post Note  Patient: Coulter Galler  Procedure(s) Performed: Procedure(s) (LRB): LEFT TOTAL HIP ARTHROPLASTY ANTERIOR APPROACH (Left)  Patient location during evaluation: PACU Anesthesia Type: Spinal Level of consciousness: oriented and awake and alert Pain management: pain level controlled Vital Signs Assessment: post-procedure vital signs reviewed and stable Respiratory status: spontaneous breathing, respiratory function stable and patient connected to nasal cannula oxygen Cardiovascular status: blood pressure returned to baseline and stable Postop Assessment: no headache, no backache and spinal receding Anesthetic complications: no Comments: Purposeful movement of both LEs in PACU.    Last Vitals:  Filed Vitals:   08/18/15 1155 08/18/15 1304  BP: 118/69 109/69  Pulse: 74 76  Temp: 36.9 C 36.8 C  Resp: 14 16    Last Pain:  Filed Vitals:   08/18/15 1306  PainSc: 1                  Davine Coba J

## 2015-08-18 NOTE — Anesthesia Preprocedure Evaluation (Addendum)
Anesthesia Evaluation  Patient identified by MRN, date of birth, ID band Patient awake    Reviewed: Allergy & Precautions, NPO status , Patient's Chart, lab work & pertinent test results  Airway Mallampati: II  TM Distance: >3 FB Neck ROM: Full    Dental no notable dental hx.    Pulmonary neg pulmonary ROS,    Pulmonary exam normal breath sounds clear to auscultation       Cardiovascular negative cardio ROS Normal cardiovascular exam Rhythm:Regular Rate:Normal     Neuro/Psych PSYCHIATRIC DISORDERS Scoliosis    GI/Hepatic negative GI ROS, Neg liver ROS,   Endo/Other  negative endocrine ROS  Renal/GU negative Renal ROS  negative genitourinary   Musculoskeletal  (+) Arthritis ,   Abdominal   Peds negative pediatric ROS (+)  Hematology negative hematology ROS (+)   Anesthesia Other Findings   Reproductive/Obstetrics negative OB ROS                            Anesthesia Physical Anesthesia Plan  ASA: II  Anesthesia Plan: Spinal   Post-op Pain Management:    Induction: Intravenous  Airway Management Planned:   Additional Equipment:   Intra-op Plan:   Post-operative Plan: Extubation in OR  Informed Consent: I have reviewed the patients History and Physical, chart, labs and discussed the procedure including the risks, benefits and alternatives for the proposed anesthesia with the patient or authorized representative who has indicated his/her understanding and acceptance.   Dental advisory given  Plan Discussed with: CRNA  Anesthesia Plan Comments: (Discussed risks and benefits of and differences between spinal and general. Discussed risks of spinal including headache, backache, failure, bleeding and hematoma, infection, and nerve damage. Patient consents to spinal. Questions answered. Coagulation studies and platelet count acceptable.)       Anesthesia Quick Evaluation

## 2015-08-18 NOTE — Interval H&P Note (Signed)
History and Physical Interval Note:  08/18/2015 8:11 AM  Mitchell Beasley  has presented today for surgery, with the diagnosis of OA LEFT HIP  The various methods of treatment have been discussed with the patient and family. After consideration of risks, benefits and other options for treatment, the patient has consented to  Procedure(s): LEFT TOTAL HIP ARTHROPLASTY ANTERIOR APPROACH (Left) as a surgical intervention .  The patient's history has been reviewed, patient examined, no change in status, stable for surgery.  I have reviewed the patient's chart and labs.  Questions were answered to the patient's satisfaction.     [name]

## 2015-08-18 NOTE — Op Note (Signed)
OPERATIVE REPORT  PREOPERATIVE DIAGNOSIS: Osteoarthritis of the Left hip.   POSTOPERATIVE DIAGNOSIS: Osteoarthritis of the Left  hip.   PROCEDURE: Left total hip arthroplasty, anterior approach.   SURGEON: Gaynelle Arabian, MD   ASSISTANT: Arlee Muslim, PA-C  ANESTHESIA:  Spinal  ESTIMATED BLOOD LOSS:- 350 ml    DRAINS: Hemovac x1.   COMPLICATIONS: None   CONDITION: PACU - hemodynamically stable.   BRIEF CLINICAL NOTE: Mitchell Beasley is a 60 y.o. male who has advanced end-  stage arthritis of his Left  hip with progressively worsening pain and  dysfunction.The patient has failed nonoperative management and presents for  total hip arthroplasty.   PROCEDURE IN DETAIL: After successful administration of spinal  anesthetic, the traction boots for the [facility] bed were placed on both  feet and the patient was placed onto the [facility] bed, boots placed into the leg  holders. The Left hip was then isolated from the perineum with plastic  drapes and prepped and draped in the usual sterile fashion. ASIS and  greater trochanter were marked and a oblique incision was made, starting  at about 1 cm lateral and 2 cm distal to the ASIS and coursing towards  the anterior cortex of the femur. The skin was cut with a 10 blade  through subcutaneous tissue to the level of the fascia overlying the  tensor fascia lata muscle. The fascia was then incised in line with the  incision at the junction of the anterior third and posterior 2/3rd. The  muscle was teased off the fascia and then the interval between the TFL  and the rectus was developed. The Hohmann retractor was then placed at  the top of the femoral neck over the capsule. The vessels overlying the  capsule were cauterized and the fat on top of the capsule was removed.  A Hohmann retractor was then placed anterior underneath the rectus  femoris to give exposure to the entire anterior capsule. A T-shaped  capsulotomy was performed. The  edges were tagged and the femoral head  was identified.       Osteophytes are removed off the superior acetabulum.  The femoral neck was then cut in situ with an oscillating saw. Traction  was then applied to the left lower extremity utilizing the [facility]  traction. The femoral head was then removed. Retractors were placed  around the acetabulum and then circumferential removal of the labrum was  performed. Osteophytes were also removed. Reaming starts at 47 mm to  medialize and  Increased in 2 mm increments to 51 mm. We reamed in  approximately 40 degrees of abduction, 20 degrees anteversion. A 52 mm  pinnacle acetabular shell was then impacted in anatomic position under  fluoroscopic guidance with excellent purchase. We did not need to place  any additional dome screws. A 32 mm neutral + 4 marathon liner was then  placed into the acetabular shell.       The femoral lift was then placed along the lateral aspect of the femur  just distal to the vastus ridge. The leg was  externally rotated and capsule  was stripped off the inferior aspect of the femoral neck down to the  level of the lesser trochanter, this was done with electrocautery. The femur was lifted after this was performed. The  leg was then placed and extended in adducted position to essentially delivering the femur. We also removed the capsule superiorly and the  piriformis from the  piriformis fossa to gain excellent exposure of the  proximal femur. Rongeur was used to remove some cancellous bone to get  into the lateral portion of the proximal femur for placement of the  initial starter reamer. The starter broaches was placed  the starter broach  and was shown to go down the center of the canal. Broaching  with the  Corail system was then performed starting at size 8, coursing  Up to size 12. A size 12 had excellent torsional and rotational  and axial stability. The trial standard offset neck was then placed  with a 32 + 1 trial  head. The hip was then reduced. We confirmed that  the stem was in the canal both on AP and lateral x-rays. It also has excellent sizing. The hip was reduced with outstanding stability through full extension, full external rotation,  and then flexion in adduction internal rotation. AP pelvis was taken  and the leg lengths were measured and found to be exactly equal. Hip  was then dislocated again and the femoral head and neck removed. The  femoral broach was removed. Size 12 Corail stem with a standard offset  neck was then impacted into the femur following native anteversion. Has  excellent purchase in the canal. Excellent torsional and rotational and  axial stability. It is confirmed to be in the canal on AP and lateral  fluoroscopic views. The 32 + 1 ceramic head was placed and the hip  reduced with outstanding stability. Again AP pelvis was taken and it  confirmed that the leg lengths were equal. The wound was then copiously  irrigated with saline solution and the capsule reattached and repaired  with Ethibond suture. 30 ml of .25% Bupivicaine injected into the capsule and into the edge of the tensor fascia lata as well as subcutaneous tissue. The fascia overlying the tensor fascia lata was  then closed with a running #1 V-Loc. Subcu was closed with interrupted  2-0 Vicryl and subcuticular running 4-0 Monocryl. Incision was cleaned  and dried. Steri-Strips and a bulky sterile dressing applied. Hemovac  drain was hooked to suction and then he was awakened and transported to  recovery in stable condition.        Please note that a surgical assistant was a medical necessity for this procedure to perform it in a safe and expeditious manner. Assistant was necessary to provide appropriate retraction of vital neurovascular structures and to prevent femoral fracture and allow for anatomic placement of the prosthesis.  Gaynelle Arabian, M.D.

## 2015-08-18 NOTE — Care Management Note (Addendum)
Case Management Note  Patient Details  Name: Mitchell Beasley MRN: [id number] Date of Birth: [birth year]  Subjective/Objective:  60 y.o. M admitted 08/18/2015 for L THA AA. Lives in private residence with spouse. PT recommending HHPT and DME: RW and [facility]. Spoke with pt who prefers [facility]. Made Cleora Fleet rep aware and Annie Sable, [facility] DME rep aware) Will be available for additional CM needs if added.    Will sign off unless additional needs are identified. Please alert CM with Consult if needed.    Expected Discharge Date:                  Expected Discharge Plan:  Skilled Nursing Facility  In-House Referral:  Clinical Social Work  Discharge planning Services  CM Consult  Post Acute Care Choice:    Choice offered to:     DME Arranged:    DME Agency:     HH Arranged:    [place] Agency:     Status of Service:  In process, will continue to follow  Medicare Important Message Given:    Date Medicare IM Given:    Medicare IM give by:    Date Additional Medicare IM Given:    Additional Medicare Important Message give by:     If discussed at [place] of Stay Meetings, dates discussed:    Additional Comments:  Delrae Sawyers, RN 08/18/2015, 2:14 PM

## 2015-08-19 LAB — BASIC METABOLIC PANEL
ANION GAP: 6 (ref 5–15)
BUN: 13 mg/dL (ref 6–20)
CHLORIDE: 104 mmol/L (ref 101–111)
CO2: 29 mmol/L (ref 22–32)
Calcium: 8.8 mg/dL — ABNORMAL LOW (ref 8.9–10.3)
Creatinine, Ser: 0.98 mg/dL (ref 0.61–1.24)
GFR calc Af Amer: >60 mL/min (ref >60)
GFR calc non Af Amer: >60 mL/min (ref >60)
GLUCOSE: 164 mg/dL — AB (ref 65–99)
POTASSIUM: 4.4 mmol/L (ref 3.5–5.1)
Sodium: 139 mmol/L (ref 135–145)

## 2015-08-19 LAB — CBC
HEMATOCRIT: 40.3 % (ref 39.0–52.0)
HEMOGLOBIN: 13.6 g/dL (ref 13.0–17.0)
MCH: 31.4 pg (ref 26.0–34.0)
MCHC: 33.7 g/dL (ref 30.0–36.0)
MCV: 93.1 fL (ref 78.0–100.0)
PLATELETS: 182 10*3/uL (ref 150–400)
RBC: 4.33 MIL/uL (ref 4.22–5.81)
RDW: 13 % (ref 11.5–15.5)
WBC: 16.1 10*3/uL — AB (ref 4.0–10.5)

## 2015-08-19 MED ORDER — OXYCODONE HCL 5 MG PO TABS: 5.0000 mg | ORAL_TABLET | ORAL | Status: AC | PRN

## 2015-08-19 MED ORDER — TRAMADOL HCL 50 MG PO TABS: 50.0000 mg | ORAL_TABLET | Freq: Four times a day (QID) | ORAL | Status: AC | PRN

## 2015-08-19 MED ORDER — RIVAROXABAN 10 MG PO TABS: 10.0000 mg | ORAL_TABLET | Freq: Every day | ORAL | Status: AC

## 2015-08-19 MED ORDER — CYCLOBENZAPRINE HCL 10 MG PO TABS: 10.0000 mg | ORAL_TABLET | Freq: Three times a day (TID) | ORAL | Status: AC | PRN

## 2015-08-19 NOTE — Progress Notes (Signed)
Physical Therapy Treatment Patient Details Name: Mitchell Beasley MRN: [id number] DOB: 09-Jul-1956 Today's Date: 08/19/2015    History of Present Illness Pt s/p L THR    PT Comments    Pt progressing well with mobility and eager for return home.  Reviewed therex, car transfers and stairs with pt and spouse.  Follow Up Recommendations  Home health PT     Equipment Recommendations  Rolling walker with 5" wheels    Recommendations for Other Services OT consult     Precautions / Restrictions Precautions Precautions: Fall Restrictions Weight Bearing Restrictions: No Other Position/Activity Restrictions: WBAT    Mobility  Bed Mobility Overal bed mobility: Needs Assistance Bed Mobility: Supine to Sit     Supine to sit: Min assist Sit to supine: Min guard   General bed mobility comments: cues for sequence and use of R LE to self assist  Transfers Overall transfer level: Needs assistance Equipment used: Rolling walker (2 wheeled) Transfers: Sit to/from Stand Sit to Stand: Supervision         General transfer comment: cues for UE placement  Ambulation/Gait Ambulation/Gait assistance: Min guard;Supervision Ambulation Distance (Feet): 200 Feet Assistive device: Rolling walker (2 wheeled) Gait Pattern/deviations: Step-through pattern;Shuffle Gait velocity: decr   General Gait Details: cues for posture, ER on L, position from RW and initial sequence   Stairs Stairs: Yes Stairs assistance: Min assist Stair Management: One rail Right;Step to pattern;Forwards;With crutches Number of Stairs: 10 General stair comments: cues for sequence and foot/crutch placement  Wheelchair Mobility    Modified Rankin (Stroke Patients Only)       Balance                                    Cognition Arousal/Alertness: Awake/alert Behavior During Therapy: WFL for tasks assessed/performed Overall Cognitive Status: Within Functional Limits for tasks assessed                       Exercises Total Joint Exercises Ankle Circles/Pumps: AROM;Both;15 reps;Supine Quad Sets: AROM;Both;10 reps;Supine Gluteal Sets: AROM;Both;Supine;10 reps Heel Slides: AAROM;Left;Supine;20 reps Hip ABduction/ADduction: AAROM;Left;Supine;15 reps Long Arc Quad: AROM;15 reps;Seated;Left    General Comments        Pertinent Vitals/Pain Pain Assessment: 0-10 Pain Score: 4  Pain Location: L hip Pain Descriptors / Indicators: Aching;Sore Pain Intervention(s): Limited activity within patient's tolerance;Monitored during session;Premedicated before session;Ice applied    Home Living Family/patient expects to be discharged to:: Private residence Living Arrangements: Spouse/significant other Available Help at Discharge: Family         Home Equipment: [name] - single point;Crutches Additional Comments: has reacher and homemade leg lifter    Prior Function Level of Independence: Independent          PT Goals (current goals can now be found in the care plan section) Acute Rehab PT Goals Patient Stated Goal: Resume active lifestyle asap PT Goal Formulation: With patient Time For Goal Achievement: 08/21/15 Potential to Achieve Goals: Good Progress towards PT goals: Progressing toward goals    Frequency  7X/week    PT Plan Current plan remains appropriate    Co-evaluation             End of Session Equipment Utilized During Treatment: Gait belt Activity Tolerance: Patient tolerated treatment well Patient left: in chair;with call bell/phone within reach;with family/visitor present     Time: 0818-0908 PT Time Calculation (min) (ACUTE ONLY): 50  min  Charges:  $Gait Training: 8-22 mins $Therapeutic Exercise: 8-22 mins $Therapeutic Activity: 8-22 mins                    G Codes:      Mitchell Beasley 14-Sep-2015, 12:44 PM

## 2015-08-19 NOTE — Evaluation (Signed)
Occupational Therapy Evaluation Patient Details Name: Mitchell Beasley MRN: [id number] DOB: 06/16/1956 Today's Date: 08/19/2015    History of Present Illness Pt s/p L THR   Clinical Impression   This 60 year old man was admitted for the above surgery. All education was completed.  No further OT is needed at this time    Follow Up Recommendations  No OT follow up    Equipment Recommendations  3 in 1 bedside comode    Recommendations for Other Services       Precautions / Restrictions Precautions Precautions: Fall Restrictions Other Position/Activity Restrictions: WBAT      Mobility Bed Mobility           Sit to supine: Min guard   General bed mobility comments: with leg lifter  Transfers   Equipment used: Rolling walker (2 wheeled) Transfers: Sit to/from Stand Sit to Stand: Supervision         General transfer comment: cues for UE placement    Balance                                            ADL Overall ADL's : Needs assistance/impaired             Lower Body Bathing: Minimal assistance;With adaptive equipment;Sit to/from stand       Lower Body Dressing: Minimal assistance;With adaptive equipment;Sit to/from stand   Toilet Transfer: Supervision/safety;Ambulation       Tub/ Shower Transfer: Walk-in shower;Supervision/safety;Ambulation     General ADL Comments: educated on sock aide and pt tried this.  He has a reacher: reviewed ADL uses.  He will get a long netted sponge.  Pt will need occasional min A.  Used leg lifter that friend loaned him and practiced shower transfer.  Educated on wiping legs/pins after use, splash guard, and bringing urinal with him when using commode     Vision     Perception     Praxis      Pertinent Vitals/Pain Pain Score: 4  Pain Location: L hip Pain Descriptors / Indicators: Aching;Tightness Pain Intervention(s): Limited activity within patient's tolerance;Monitored during  session;Premedicated before session;Repositioned     Hand Dominance     Extremity/Trunk Assessment Upper Extremity Assessment Upper Extremity Assessment: Overall WFL for tasks assessed           Communication Communication Communication: No difficulties   Cognition Arousal/Alertness: Awake/alert Behavior During Therapy: WFL for tasks assessed/performed Overall Cognitive Status: Within Functional Limits for tasks assessed                     General Comments       Exercises       Shoulder Instructions      Home Living Family/patient expects to be discharged to:: Private residence Living Arrangements: Spouse/significant other Available Help at Discharge: Family               Bathroom Shower/Tub: Walk-in Psychologist, prison and probation services: Standard     Home Equipment: Radio producer - single point;Crutches   Additional Comments: has reacher and homemade leg lifter      Prior Functioning/Environment Level of Independence: Independent             OT Diagnosis: Acute pain   OT Problem List:     OT Treatment/Interventions:      OT Goals(Current goals can be found in the  care plan section) Acute Rehab OT Goals Patient Stated Goal: Resume active lifestyle asap OT Goal Formulation: All assessment and education complete, DC therapy  OT Frequency:     Barriers to D/C:            Co-evaluation              End of Session    Activity Tolerance: Patient tolerated treatment well Patient left: in chair;with call bell/phone within reach;with family/visitor present   Time: [id number] OT Time Calculation (min): 24 min Charges:  OT General Charges $OT Visit: 1 Procedure OT Evaluation $OT Eval Low Complexity: 1 Procedure G-Codes:    Earnie Bechard 09/13/15, 9:50 AM  Lesle Chris, OTR/L [phone] 09/13/2015

## 2015-08-19 NOTE — Progress Notes (Signed)
   Subjective: 1 Day Post-Op Procedure(s) (LRB): LEFT TOTAL HIP ARTHROPLASTY ANTERIOR APPROACH (Left) Patient reports pain as mild.   Patient seen in rounds with Dr. Wynelle Link. Patient is well, and has had no acute complaints or problems We will start therapy today.  If they do well with therapy and meets all goals, then will allow home later this afternoon following therapy. Plan is to go Home after hospital stay.  Objective: Vital signs in last 24 hours: Temp:  [97.4 F (36.3 C)-98.7 F (37.1 C)] 97.4 F (36.3 C) (01/05 0606) Pulse Rate:  [61-93] 76 (01/05 0606) Resp:  [11-20] 19 (01/05 0606) BP: (99-134)/(59-80) 109/67 mmHg (01/05 0606) SpO2:  [96 %-100 %] 98 % (01/05 0606)  Intake/Output from previous day:  Intake/Output Summary (Last 24 hours) at 08/19/15 0922 Last data filed at 08/19/15 0607  Gross per 24 hour  Intake 4423.34 ml  Output   4811 ml  Net -387.66 ml    Intake/Output this shift: UOP 1600 since around [place]  Labs:  Recent Labs  08/19/15 0550  HGB 13.6    Recent Labs  08/19/15 0550  WBC 16.1*  RBC 4.33  HCT 40.3  PLT 182    Recent Labs  08/19/15 0550  NA 139  K 4.4  CL 104  CO2 29  BUN 13  CREATININE 0.98  GLUCOSE 164*  CALCIUM 8.8*   No results for input(s): LABPT, INR in the last 72 hours.  EXAM General - Patient is Alert, Appropriate and Oriented Extremity - Neurovascular intact Sensation intact distally Dorsiflexion/Plantar flexion intact Dressing - dressing C/D/I Motor Function - intact, moving foot and toes well on exam.  Hemovac pulled without difficulty.  Past Medical History  Diagnosis Date  . Primary snoring     wears mouth guard  . DDD (degenerative disc disease)     oa  . Cancer ([place]) nov 2016    basal cell skin cancer removed   . Scoliosis   . DJD (degenerative joint disease)   . ADHD (attention deficit hyperactivity disorder)     Assessment/Plan: 1 Day Post-Op Procedure(s) (LRB): LEFT TOTAL HIP  ARTHROPLASTY ANTERIOR APPROACH (Left) Principal Problem:   OA (osteoarthritis) of hip  Estimated body mass index is 27.25 kg/(m^2) as calculated from the following:   Height as of this encounter: 5\' 8"  (1.727 m).   Weight as of this encounter: 81.285 kg (179 lb 3.2 oz). Advance diet Up with therapy Discharge home with home health  DVT Prophylaxis - Xarelto Weight Bearing As Tolerated left Leg Hemovac Pulled Begin Therapy  If meets goals and able to go home: Discharge home with home health Diet - Regular diet Follow up - in 2 weeks Activity - WBAT Disposition - Home Condition Upon Discharge - Good D/C Meds - See DC Summary DVT Prophylaxis - Xarelto for three weeks, and then an 81 mg Aspirin daily for three additional weeks.  Arlee Muslim, PA-C Orthopaedic Surgery 08/19/2015, 9:22 AM

## 2015-08-19 NOTE — Discharge Summary (Signed)
Physician Discharge Summary   Patient ID: Mitchell Beasley MRN: [id number] DOB/AGE: 1955-08-20 60 y.o.  Admit date: 08/18/2015 Discharge date: 08-19-2015  Primary Diagnosis:  Osteoarthritis of the Left hip.   Admission Diagnoses:  Past Medical History  Diagnosis Date  . Primary snoring     wears mouth guard  . DDD (degenerative disc disease)     oa  . Cancer ([place]) nov 2016    basal cell skin cancer removed   . Scoliosis   . DJD (degenerative joint disease)   . ADHD (attention deficit hyperactivity disorder)    Discharge Diagnoses:   Principal Problem:   OA (osteoarthritis) of hip  Estimated body mass index is 27.25 kg/(m^2) as calculated from the following:   Height as of this encounter: _0  (1.727 m).   Weight as of this encounter: 81.285 kg (179 lb 3.2 oz).  Procedure(s) (LRB): LEFT TOTAL HIP ARTHROPLASTY ANTERIOR APPROACH (Left)   Consults: None  HPI: Mitchell Beasley is a 60 y.o. male who has advanced end-  stage arthritis of his Left hip with progressively worsening pain and  dysfunction.The patient has failed nonoperative management and presents for  total hip arthroplasty.   Laboratory Data: Admission on 08/18/2015  Component Date Value Ref Range Status  . WBC 08/19/2015 16.1* 4.0 - 10.5 K/uL Final  . RBC 08/19/2015 4.33  4.22 - 5.81 MIL/uL Final  . Hemoglobin 08/19/2015 13.6  13.0 - 17.0 g/dL Final  . HCT 08/19/2015 40.3  39.0 - 52.0 % Final  . MCV 08/19/2015 93.1  78.0 - 100.0 fL Final  . MCH 08/19/2015 31.4  26.0 - 34.0 pg Final  . MCHC 08/19/2015 33.7  30.0 - 36.0 g/dL Final  . RDW 08/19/2015 13.0  11.5 - 15.5 % Final  . Platelets 08/19/2015 182  150 - 400 K/uL Final  . Sodium 08/19/2015 139  135 - 145 mmol/L Final  . Potassium 08/19/2015 4.4  3.5 - 5.1 mmol/L Final  . Chloride 08/19/2015 104  101 - 111 mmol/L Final  . CO2 08/19/2015 29  22 - 32 mmol/L Final  . Glucose, Bld 08/19/2015 164* 65 - 99 mg/dL Final  . BUN 08/19/2015 13  6 - 20 mg/dL  Final  . Creatinine, Ser 08/19/2015 0.98  0.61 - 1.24 mg/dL Final  . Calcium 08/19/2015 8.8* 8.9 - 10.3 mg/dL Final  . GFR calc non Af Amer 08/19/2015 >60  >60 mL/min Final  . GFR calc Af Amer 08/19/2015 >60  >60 mL/min Final   Comment: (NOTE) The eGFR has been calculated using the CKD EPI equation. This calculation has not been validated in all clinical situations. eGFR's persistently <60 mL/min signify possible Chronic Kidney Disease.   Georgiann Hahn gap 08/19/2015 6  5 - 15 Final  Hospital Outpatient Visit on 08/10/2015  Component Date Value Ref Range Status  . aPTT 08/10/2015 28  24 - 37 seconds Final  . WBC 08/10/2015 7.3  4.0 - 10.5 K/uL Final  . RBC 08/10/2015 4.54  4.22 - 5.81 MIL/uL Final  . Hemoglobin 08/10/2015 14.2  13.0 - 17.0 g/dL Final  . HCT 08/10/2015 42.2  39.0 - 52.0 % Final  . MCV 08/10/2015 93.0  78.0 - 100.0 fL Final  . MCH 08/10/2015 31.3  26.0 - 34.0 pg Final  . MCHC 08/10/2015 33.6  30.0 - 36.0 g/dL Final  . RDW 08/10/2015 13.1  11.5 - 15.5 % Final  . Platelets 08/10/2015 172  150 - 400 K/uL Final  . Sodium 08/10/2015  142  135 - 145 mmol/L Final  . Potassium 08/10/2015 3.6  3.5 - 5.1 mmol/L Final  . Chloride 08/10/2015 105  101 - 111 mmol/L Final  . CO2 08/10/2015 29  22 - 32 mmol/L Final  . Glucose, Bld 08/10/2015 96  65 - 99 mg/dL Final  . BUN 08/10/2015 19  6 - 20 mg/dL Final  . Creatinine, Ser 08/10/2015 0.94  0.61 - 1.24 mg/dL Final  . Calcium 08/10/2015 9.0  8.9 - 10.3 mg/dL Final  . Total Protein 08/10/2015 6.8  6.5 - 8.1 g/dL Final  . Albumin 08/10/2015 4.1  3.5 - 5.0 g/dL Final  . AST 08/10/2015 33  15 - 41 U/L Final  . ALT 08/10/2015 29  17 - 63 U/L Final  . Alkaline Phosphatase 08/10/2015 50  38 - 126 U/L Final  . Total Bilirubin 08/10/2015 0.8  0.3 - 1.2 mg/dL Final  . GFR calc non Af Amer 08/10/2015 >60  >60 mL/min Final  . GFR calc Af Amer 08/10/2015 >60  >60 mL/min Final   Comment: (NOTE) The eGFR has been calculated using the CKD EPI  equation. This calculation has not been validated in all clinical situations. eGFR's persistently <60 mL/min signify possible Chronic Kidney Disease.   . Anion gap 08/10/2015 8  5 - 15 Final  . Prothrombin Time 08/10/2015 13.4  11.6 - 15.2 seconds Final  . INR 08/10/2015 1.00  0.00 - 1.49 Final  . ABO/RH(D) 08/10/2015 A NEG   Final  . Antibody Screen 08/10/2015 NEG   Final  . Sample Expiration 08/10/2015 08/21/2015   Final  . Extend sample reason 08/10/2015 NO TRANSFUSIONS OR PREGNANCY IN THE PAST 3 MONTHS   Final  . MRSA, PCR 08/10/2015 NEGATIVE  NEGATIVE Final  . Staphylococcus aureus 08/10/2015 POSITIVE* NEGATIVE Final   Comment:        The Xpert SA Assay (FDA approved for NASAL specimens in patients over 22 years of age), is one component of a comprehensive surveillance program.  Test performance has been validated by [facility] for patients greater than or equal to 20 year old. It is not intended to diagnose infection nor to guide or monitor treatment.   . Color, Urine 08/10/2015 YELLOW  YELLOW Final  . APPearance 08/10/2015 CLOUDY* CLEAR Final  . Specific Gravity, Urine 08/10/2015 1.016  1.005 - 1.030 Final  . pH 08/10/2015 5.5  5.0 - 8.0 Final  . Glucose, UA 08/10/2015 NEGATIVE  NEGATIVE mg/dL Final  . Hgb urine dipstick 08/10/2015 NEGATIVE  NEGATIVE Final  . Bilirubin Urine 08/10/2015 NEGATIVE  NEGATIVE Final  . Ketones, ur 08/10/2015 NEGATIVE  NEGATIVE mg/dL Final  . Protein, ur 08/10/2015 NEGATIVE  NEGATIVE mg/dL Final  . Nitrite 08/10/2015 NEGATIVE  NEGATIVE Final  . Leukocytes, UA 08/10/2015 NEGATIVE  NEGATIVE Final   MICROSCOPIC NOT DONE ON URINES WITH NEGATIVE PROTEIN, BLOOD, LEUKOCYTES, NITRITE, OR GLUCOSE <1000 mg/dL.  . ABO/RH(D) 08/10/2015 A NEG   Final     X-Rays:Dg Pelvis Portable  08/18/2015  CLINICAL DATA:  Left hip arthroplasty EXAM: PORTABLE PELVIS 1-2 VIEWS COMPARISON:  None. FINDINGS: Total left hip arthroplasty without periprosthetic fracture or  dislocation. The visualized pelvis is intact. Negative right hip. IMPRESSION: No adverse finding after left hip arthroplasty. Electronically Signed   By: Monte Fantasia M.D.   On: 08/18/2015 10:43   Dg C-arm 1-60 Min-no Report  08/18/2015  CLINICAL DATA: surgery C-ARM 1-60 MINUTES Fluoroscopy was utilized by the requesting physician.  No radiographic interpretation.  EKG:No orders found for this or any previous visit.   Hospital Course: Patient was admitted to [facility] and taken to the OR and underwent the above state procedure without complications.  Patient tolerated the procedure well and was later transferred to the recovery room and then to the orthopaedic floor for postoperative care.  They were given PO and IV analgesics for pain control following their surgery.  They were given 24 hours of postoperative antibiotics of  Anti-infectives    Start     Dose/Rate Route Frequency Ordered Stop   08/18/15 1430  ceFAZolin (ANCEF) IVPB 2 g/50 mL premix     2 g 100 mL/hr over 30 Minutes Intravenous Every 6 hours 08/18/15 1145 08/18/15 2200   08/18/15 0713  ceFAZolin (ANCEF) IVPB 2 g/50 mL premix     2 g 100 mL/hr over 30 Minutes Intravenous On call to O.R. 08/18/15 5188 08/18/15 4166     and started on DVT prophylaxis in the form of Xarelto.   PT and OT were ordered for total hip protocol.  The patient was allowed to be WBAT with therapy. Discharge planning was consulted to help with postop disposition and equipment needs.  Patient had a good night on the evening of surgery.  He was able to get up and walk about 200 feet the day of surgery.  They started to get up OOB with therapy on day one again.  Hemovac drain was pulled without difficulty.  Dressing was checked and it was clean and dry without drainage.  Patient was seen in rounds and it was felt that as long as he did well with PT on POD 1, he would be ready to go home later that same day.  Arrangements were made for  discharge.  Discharge home with home health Diet - Regular diet Follow up - in 2 weeks Activity - WBAT Disposition - Home Condition Upon Discharge - Good D/C Meds - See DC Summary DVT Prophylaxis - Xarelto for three weeks, and then an 81 mg Aspirin daily for three additional weeks.  Discharge Instructions    Call MD / Call 911    Complete by:  As directed   If you experience chest pain or shortness of breath, CALL 911 and be transported to the hospital emergency room.  If you develope a fever above 101 F, pus (white drainage) or increased drainage or redness at the wound, or calf pain, call your surgeon's office.     Change dressing    Complete by:  As directed   You may change your dressing dressing daily with sterile 4 x 4 inch gauze dressing and paper tape.  Do not submerge the incision under water.     Constipation Prevention    Complete by:  As directed   Drink plenty of fluids.  Prune juice may be helpful.  You may use a stool softener, such as Colace (over the counter) 100 mg twice a day.  Use MiraLax (over the counter) for constipation as needed.     Diet general    Complete by:  As directed      Discharge instructions    Complete by:  As directed   Pick up stool softner and laxative for home use following surgery while on pain medications. Do not submerge incision under water. May remove the surgical dressing tomorrow, Friday 08/20/2015, and then apply a dry gauze dressing daily. Please use good hand washing techniques while changing dressing each day. May shower starting  three days after surgery starting Saturday 08/21/2015. Please use a clean towel to pat the incision dry following showers. Continue to use ice for pain and swelling after surgery. Do not use any lotions or creams on the incision until instructed by your surgeon.  Postoperative Constipation Protocol  Constipation - defined medically as fewer than three stools per week and severe constipation as less than one  stool per week.  One of the most common issues patients have following surgery is constipation. Even if you have a regular bowel pattern at home, your normal regimen is likely to be disrupted due to multiple reasons following surgery. Combination of anesthesia, postoperative narcotics, change in appetite and fluid intake all can affect your bowels. In order to avoid complications following surgery, here are some recommendations in order to help you during your recovery period.  Colace (docusate) - Pick up an over-the-counter form of Colace or another stool softener and take twice a day as long as you are requiring postoperative pain medications. Take with a full glass of water daily. If you experience loose stools or diarrhea, hold the colace until you stool forms back up. If your symptoms do not get better within 1 week or if they get worse, check with your doctor.  Dulcolax (bisacodyl) - Pick up over-the-counter and take as directed by the product packaging as needed to assist with the movement of your bowels. Take with a full glass of water. Use this product as needed if not relieved by Colace only.   MiraLax (polyethylene glycol) - Pick up over-the-counter to have on hand. MiraLax is a solution that will increase the amount of water in your bowels to assist with bowel movements. Take as directed and can mix with a glass of water, juice, soda, coffee, or tea. Take if you go more than two days without a movement. Do not use MiraLax more than once per day. Call your doctor if you are still constipated or irregular after using this medication for 7 days in a row.  If you continue to have problems with postoperative constipation, please contact the office for further assistance and recommendations. If you experience "the worst abdominal pain ever" or develop nausea or vomiting, please contact the office immediatly for further recommendations for treatment.  Total Hip Protocol.  Take Xarelto  for a total of three weeks, then discontinue Xarelto. Once the patient has completed the blood thinner regimen, then take a Baby 81 mg Aspirin daily for three more weeks.     Do not sit on low chairs, stoools or toilet seats, as it may be difficult to get up from low surfaces    Complete by:  As directed      Driving restrictions    Complete by:  As directed   No driving until released by the physician.     Increase activity slowly as tolerated    Complete by:  As directed      Lifting restrictions    Complete by:  As directed   No lifting until released by the physician.     Patient may shower    Complete by:  As directed   You may shower without a dressing once there is no drainage.  Do not wash over the wound.  If drainage remains, do not shower until drainage stops.     TED hose    Complete by:  As directed   Use stockings (TED hose) for 3 weeks on both leg(s).  You may remove  them at night for sleeping.     Weight bearing as tolerated    Complete by:  As directed   Laterality:  left  Extremity:  Lower            Medication List    STOP taking these medications        GUMMI BEAR MULTIVITAMIN/MIN PO     HYDROcodone-acetaminophen 5-325 MG tablet  Commonly known as:  NORCO/VICODIN      TAKE these medications        cyclobenzaprine 10 MG tablet  Commonly known as:  FLEXERIL  Take 1 tablet (10 mg total) by mouth 3 (three) times daily as needed for muscle spasms.     imiquimod 5 % cream  Commonly known as:  ALDARA  Apply 1 application topically daily as needed (For pre-cancerous spots. Can take for up to 14 days for each spot.).     ketotifen 0.025 % ophthalmic solution  Commonly known as:  ZADITOR  Place 1 drop into both eyes 3 (three) times daily as needed (eye irritation).     methylphenidate 40 MG CR capsule  Commonly known as:  METADATE CD  Take 40 mg by mouth daily.     oxyCODONE 5 MG immediate release tablet  Commonly known as:  Oxy IR/ROXICODONE  Take  1-2 tablets (5-10 mg total) by mouth every 3 (three) hours as needed for moderate pain or severe pain.     rivaroxaban 10 MG Tabs tablet  Commonly known as:  XARELTO  Take 1 tablet (10 mg total) by mouth daily with breakfast. Take Xarelto for a total of three weeks, then discontinue Xarelto. Once the patient has completed the blood thinner regimen, then take a Baby 81 mg Aspirin daily for three more weeks.     traMADol 50 MG tablet  Commonly known as:  ULTRAM  Take 1-2 tablets (50-100 mg total) by mouth every 6 (six) hours as needed (mild pain).     zolpidem 10 MG tablet  Commonly known as:  AMBIEN  Take 10 mg by mouth at bedtime as needed for sleep.           Follow-up Information    Follow up with [facility].   Why:  RW and 3n1 will be delivered prior to discharge.    Contact information:   [address] [place] [address] [phone]       Follow up with [facility].   Why:  HHPT. Representative will contact you within 12-24 hours to arrange initial visit   Contact information:   [address] [place] [address] [phone]       Follow up with [name], MD On 08/31/2015.   Specialty:  Orthopedic Surgery   Why:  Call office at [phone] to setup appointment in two weeks on Tuesday 08/31/2015 with Dr. Wynelle Link.   Contact information:   [address] [place] [address] [phone]       Signed: Arlee Muslim, PA-C Orthopaedic Surgery 08/19/2015, 9:34 AM

## 2015-08-19 NOTE — Discharge Instructions (Addendum)
° °Dr. Frank [name] °Total Joint Specialist °[facility] °[address] °[place], [place] [address] °[phone] ° °ANTERIOR APPROACH TOTAL HIP REPLACEMENT POSTOPERATIVE DIRECTIONS ° ° °Hip Rehabilitation, Guidelines Following Surgery  °The results of a hip operation are greatly improved after range of motion and muscle strengthening exercises. Follow all safety measures which are given to protect your hip. If any of these exercises cause increased pain or swelling in your joint, decrease the amount until you are comfortable again. Then slowly increase the exercises. Call your caregiver if you have problems or questions.  ° °HOME CARE INSTRUCTIONS  °Remove items at home which could result in a fall. This includes throw rugs or furniture in walking pathways.  °· ICE to the affected hip every three hours for 30 minutes at a time and then as needed for pain and swelling.  Continue to use ice on the hip for pain and swelling from surgery. You may notice swelling that will progress down to the foot and ankle.  This is normal after surgery.  Elevate the leg when you are not up walking on it.   °· Continue to use the breathing machine which will help keep your temperature down.  It is common for your temperature to cycle up and down following surgery, especially at night when you are not up moving around and exerting yourself.  The breathing machine keeps your lungs expanded and your temperature down. ° ° °DIET °You may resume your previous home diet once your are discharged from the hospital. ° °DRESSING / WOUND CARE / SHOWERING °You may shower 3 days after surgery, but keep the wounds dry during showering.  You may use an occlusive plastic wrap (Press'n Seal for example), NO SOAKING/SUBMERGING IN THE BATHTUB.  If the bandage gets wet, change with a clean dry gauze.  If the incision gets wet, pat the wound dry with a clean towel. °You may start showering once you are discharged home but do not  submerge the incision under water. Just pat the incision dry and apply a dry gauze dressing on daily. °Change the surgical dressing daily and reapply a dry dressing each time. ° °ACTIVITY °Walk with your walker as instructed. °Use walker as long as suggested by your caregivers. °Avoid periods of inactivity such as sitting longer than an hour when not asleep. This helps prevent blood clots.  °You may resume a sexual relationship in one month or when given the OK by your doctor.  °You may return to work once you are cleared by your doctor.  °Do not drive a car for 6 weeks or until released by you surgeon.  °Do not drive while taking narcotics. ° °WEIGHT BEARING °Weight bearing as tolerated with assist device (walker, cane, etc) as directed, use it as long as suggested by your surgeon or therapist, typically at least 4-6 weeks. ° °POSTOPERATIVE CONSTIPATION PROTOCOL °Constipation - defined medically as fewer than three stools per week and severe constipation as less than one stool per week. ° °One of the most common issues patients have following surgery is constipation.  Even if you have a regular bowel pattern at home, your normal regimen is likely to be disrupted due to multiple reasons following surgery.  Combination of anesthesia, postoperative narcotics, change in appetite and fluid intake all can affect your bowels.  In order to avoid complications following surgery, here are some recommendations in order to help you during your recovery period. ° °Colace (docusate) - Pick up an over-the-counter   form of Colace or another stool softener and take twice a day as long as you are requiring postoperative pain medications.  Take with a full glass of water daily.  If you experience loose stools or diarrhea, hold the colace until you stool forms back up.  If your symptoms do not get better within 1 week or if they get worse, check with your doctor. ° °Dulcolax (bisacodyl) - Pick up over-the-counter and take as directed  by the product packaging as needed to assist with the movement of your bowels.  Take with a full glass of water.  Use this product as needed if not relieved by Colace only.  ° °MiraLax (polyethylene glycol) - Pick up over-the-counter to have on hand.  MiraLax is a solution that will increase the amount of water in your bowels to assist with bowel movements.  Take as directed and can mix with a glass of water, juice, soda, coffee, or tea.  Take if you go more than two days without a movement. °Do not use MiraLax more than once per day. Call your doctor if you are still constipated or irregular after using this medication for 7 days in a row. ° °If you continue to have problems with postoperative constipation, please contact the office for further assistance and recommendations.  If you experience "the worst abdominal pain ever" or develop nausea or vomiting, please contact the office immediatly for further recommendations for treatment. ° °ITCHING ° If you experience itching with your medications, try taking only a single pain pill, or even half a pain pill at a time.  You can also use Benadryl over the counter for itching or also to help with sleep.  ° °TED HOSE STOCKINGS °Wear the elastic stockings on both legs for three weeks following surgery during the day but you may remove then at night for sleeping. ° °MEDICATIONS °See your medication summary on the “After Visit Summary” that the nursing staff will review with you prior to discharge.  You may have some home medications which will be placed on hold until you complete the course of blood thinner medication.  It is important for you to complete the blood thinner medication as prescribed by your surgeon.  Continue your approved medications as instructed at time of discharge. ° °PRECAUTIONS °If you experience chest pain or shortness of breath - call 911 immediately for transfer to the hospital emergency department.  °If you develop a fever greater that 101 F,  purulent drainage from wound, increased redness or drainage from wound, foul odor from the wound/dressing, or calf pain - CONTACT YOUR SURGEON.   °                                                °FOLLOW-UP APPOINTMENTS °Make sure you keep all of your appointments after your operation with your surgeon and caregivers. You should call the office at the above phone number and make an appointment for approximately two weeks after the date of your surgery or on the date instructed by your surgeon outlined in the "After Visit Summary". ° °RANGE OF MOTION AND STRENGTHENING EXERCISES  °These exercises are designed to help you keep full movement of your hip joint. Follow your caregiver's or physical therapist's instructions. Perform all exercises about fifteen times, three times per day or as directed. Exercise both hips, even if you   have had only one joint replacement. These exercises can be done on a training (exercise) mat, on the floor, on a table or on a bed. Use whatever works the best and is most comfortable for you. Use music or television while you are exercising so that the exercises are a pleasant break in your day. This will make your life better with the exercises acting as a break in routine you can look forward to.  Lying on your back, slowly slide your foot toward your buttocks, raising your knee up off the floor. Then slowly slide your foot back down until your leg is straight again.  Lying on your back spread your legs as far apart as you can without causing discomfort.  Lying on your side, raise your upper leg and foot straight up from the floor as far as is comfortable. Slowly lower the leg and repeat.  Lying on your back, tighten up the muscle in the front of your thigh (quadriceps muscles). You can do this by keeping your leg straight and trying to raise your heel off the floor. This helps strengthen the largest muscle supporting your knee.  Lying on your back, tighten up the muscles of your  buttocks both with the legs straight and with the knee bent at a comfortable angle while keeping your heel on the floor.   IF YOU ARE TRANSFERRED TO A SKILLED REHAB FACILITY If the patient is transferred to a skilled rehab facility following release from the hospital, a list of the current medications will be sent to the facility for the patient to continue.  When discharged from the skilled rehab facility, please have the facility set up the patient's [place] prior to being released. Also, the skilled facility will be responsible for providing the patient with their medications at time of release from the facility to include their pain medication, the muscle relaxants, and their blood thinner medication. If the patient is still at the rehab facility at time of the two week follow up appointment, the skilled rehab facility will also need to assist the patient in arranging follow up appointment in our office and any transportation needs.  MAKE SURE YOU:  Understand these instructions.  Get help right away if you are not doing well or get worse.   Pick up stool softner and laxative for home use following surgery while on pain medications. Do not submerge incision under water. May remove the surgical dressing tomorrow, Friday 08/20/2015, and then apply a dry gauze dressing daily. Please use good hand washing techniques while changing dressing each day. May shower starting three days after surgery starting Saturday 08/21/2015. Please use a clean towel to pat the incision dry following showers. Continue to use ice for pain and swelling after surgery. Do not use any lotions or creams on the incision until instructed by your surgeon.  Take Xarelto for a total of three weeks, then discontinue Xarelto. Once the patient has completed the blood thinner regimen, then take a Baby 81 mg Aspirin daily for three more weeks.    Information on my medicine - XARELTO (Rivaroxaban)  This  medication education was reviewed with me or my healthcare representative as part of my discharge preparation.  The pharmacist that spoke with me during my hospital stay was:  Clovis Riley, [facility]  Why was Xarelto prescribed for you? Xarelto was prescribed for you to reduce the risk of blood clots forming after orthopedic surgery. The medical term for these abnormal blood  clots is venous thromboembolism (VTE).  What do you need to know about xarelto ? Take your Xarelto ONCE DAILY at the same time every day. You may take it either with or without food.  If you have difficulty swallowing the tablet whole, you may crush it and mix in applesauce just prior to taking your dose.  Take Xarelto exactly as prescribed by your doctor and DO NOT stop taking Xarelto without talking to the doctor who prescribed the medication.  Stopping without other VTE prevention medication to take the place of Xarelto may increase your risk of developing a clot.  After discharge, you should have regular check-up appointments with your healthcare provider that is prescribing your Xarelto.    What do you do if you miss a dose? If you miss a dose, take it as soon as you remember on the same day then continue your regularly scheduled once daily regimen the next day. Do not take two doses of Xarelto on the same day.   Important Safety Information A possible side effect of Xarelto is bleeding. You should call your healthcare provider right away if you experience any of the following: ? Bleeding from an injury or your nose that does not stop. ? Unusual colored urine (red or dark brown) or unusual colored stools (red or black). ? Unusual bruising for unknown reasons. ? A serious fall or if you hit your head (even if there is no bleeding).  Some medicines may interact with Xarelto and might increase your risk of bleeding while on Xarelto. To help avoid this, consult your healthcare provider or pharmacist  prior to using any new prescription or non-prescription medications, including herbals, vitamins, non-steroidal anti-inflammatory drugs (NSAIDs) and supplements.  This website has more information on Xarelto: [email or web address]

## 2016-04-04 IMAGING — XA DG FLUORO GUIDE NDL PLC/BX
1 series · 1 of 1 positions shown · non-contrast
Comparison: none

CLINICAL DATA: Left hip pain. Excellent relief from the
intra-articular injection 04/06/2015.

[Series 1: ortho standard · 1 of 1 slices shown]
[im 1/1]
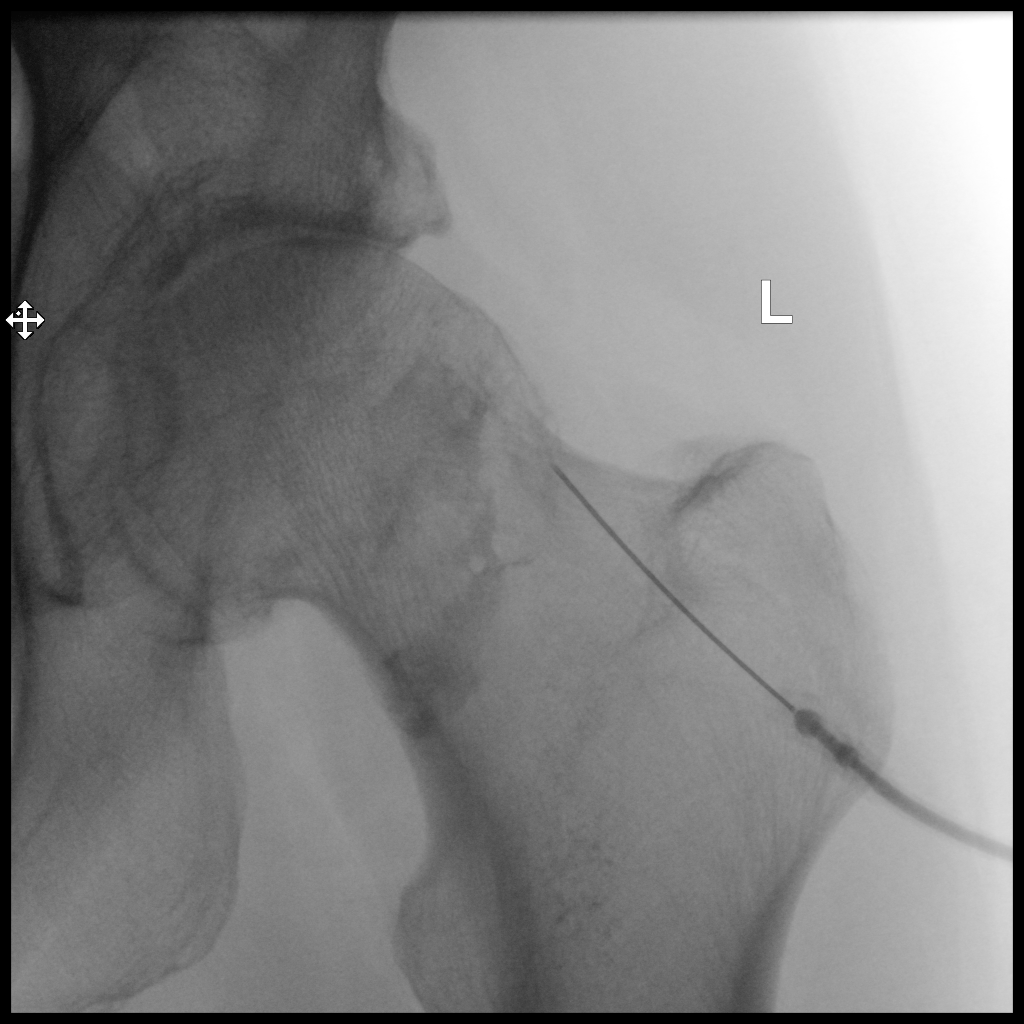

[1 of 1 positions shown; findings below may reference images not displayed]

EXAM:
LEFT HIP INJECTION UNDER FLUOROSCOPY

FLUOROSCOPY TIME:  Radiation Exposure Index (as provided by the
fluoroscopic device): 38.98 microGray*m^2

Fluoroscopy Time (in minutes and seconds):  0 minutes 7 seconds

PROCEDURE:
Overlying skin prepped with Betadine, draped in the usual sterile
fashion, and infiltrated locally with buffered Lidocaine. A 3.5 inch
22 gauge spinal needle was advanced to the lateral aspect of the
left femoral head- neck junction. 1 ml of Lidocaine injected easily.
Diagnostic injection of iodinated contrast demonstrates
intra-articular spread without intravascular component.

120 mg Depo-Medrol and 3.5 mL of 1% lidocaine were then
administered. No immediate complication.
IMPRESSION: Technically successful left hip injection under fluoroscopy.

## 2017-01-10 DIAGNOSIS — L814 Other melanin hyperpigmentation: Secondary | ICD-10-CM | POA: Diagnosis not present

## 2017-01-10 DIAGNOSIS — C44319 Basal cell carcinoma of skin of other parts of face: Secondary | ICD-10-CM | POA: Diagnosis not present

## 2017-01-10 DIAGNOSIS — L738 Other specified follicular disorders: Secondary | ICD-10-CM | POA: Diagnosis not present

## 2017-01-10 DIAGNOSIS — L821 Other seborrheic keratosis: Secondary | ICD-10-CM | POA: Diagnosis not present

## 2017-01-10 DIAGNOSIS — L570 Actinic keratosis: Secondary | ICD-10-CM | POA: Diagnosis not present

## 2017-01-10 DIAGNOSIS — Z85828 Personal history of other malignant neoplasm of skin: Secondary | ICD-10-CM | POA: Diagnosis not present

## 2017-01-12 DIAGNOSIS — R972 Elevated prostate specific antigen [PSA]: Secondary | ICD-10-CM | POA: Diagnosis not present

## 2017-01-31 DIAGNOSIS — C44319 Basal cell carcinoma of skin of other parts of face: Secondary | ICD-10-CM | POA: Diagnosis not present

## 2017-06-11 DIAGNOSIS — Z23 Encounter for immunization: Secondary | ICD-10-CM | POA: Diagnosis not present

## 2017-06-12 ENCOUNTER — Encounter: Payer: Self-pay | Admitting: Internal Medicine

## 2017-06-27 DIAGNOSIS — H26492 Other secondary cataract, left eye: Secondary | ICD-10-CM | POA: Diagnosis not present

## 2017-06-27 DIAGNOSIS — Z961 Presence of intraocular lens: Secondary | ICD-10-CM | POA: Diagnosis not present

## 2017-07-11 DIAGNOSIS — L821 Other seborrheic keratosis: Secondary | ICD-10-CM | POA: Diagnosis not present

## 2017-07-11 DIAGNOSIS — Z85828 Personal history of other malignant neoplasm of skin: Secondary | ICD-10-CM | POA: Diagnosis not present

## 2017-07-11 DIAGNOSIS — C4441 Basal cell carcinoma of skin of scalp and neck: Secondary | ICD-10-CM | POA: Diagnosis not present

## 2017-07-11 DIAGNOSIS — C44329 Squamous cell carcinoma of skin of other parts of face: Secondary | ICD-10-CM | POA: Diagnosis not present

## 2017-07-11 DIAGNOSIS — D225 Melanocytic nevi of trunk: Secondary | ICD-10-CM | POA: Diagnosis not present

## 2018-01-02 DIAGNOSIS — Z85828 Personal history of other malignant neoplasm of skin: Secondary | ICD-10-CM | POA: Diagnosis not present

## 2018-01-02 DIAGNOSIS — L570 Actinic keratosis: Secondary | ICD-10-CM | POA: Diagnosis not present

## 2018-01-02 DIAGNOSIS — C4441 Basal cell carcinoma of skin of scalp and neck: Secondary | ICD-10-CM | POA: Diagnosis not present

## 2018-01-02 DIAGNOSIS — L814 Other melanin hyperpigmentation: Secondary | ICD-10-CM | POA: Diagnosis not present

## 2018-01-02 DIAGNOSIS — D1801 Hemangioma of skin and subcutaneous tissue: Secondary | ICD-10-CM | POA: Diagnosis not present

## 2018-02-20 DIAGNOSIS — H26492 Other secondary cataract, left eye: Secondary | ICD-10-CM | POA: Diagnosis not present

## 2018-02-20 DIAGNOSIS — Z961 Presence of intraocular lens: Secondary | ICD-10-CM | POA: Diagnosis not present

## 2018-02-27 DIAGNOSIS — H26492 Other secondary cataract, left eye: Secondary | ICD-10-CM | POA: Diagnosis not present

## 2018-02-27 DIAGNOSIS — Z961 Presence of intraocular lens: Secondary | ICD-10-CM | POA: Diagnosis not present

## 2018-05-17 DIAGNOSIS — Z23 Encounter for immunization: Secondary | ICD-10-CM | POA: Diagnosis not present

## 2018-05-22 DIAGNOSIS — M25561 Pain in right knee: Secondary | ICD-10-CM | POA: Diagnosis not present

## 2018-06-25 DIAGNOSIS — Z961 Presence of intraocular lens: Secondary | ICD-10-CM | POA: Diagnosis not present

## 2018-06-25 DIAGNOSIS — H35372 Puckering of macula, left eye: Secondary | ICD-10-CM | POA: Diagnosis not present

## 2018-07-24 DIAGNOSIS — Z85828 Personal history of other malignant neoplasm of skin: Secondary | ICD-10-CM | POA: Diagnosis not present

## 2018-07-24 DIAGNOSIS — L814 Other melanin hyperpigmentation: Secondary | ICD-10-CM | POA: Diagnosis not present

## 2018-07-24 DIAGNOSIS — L570 Actinic keratosis: Secondary | ICD-10-CM | POA: Diagnosis not present

## 2018-07-24 DIAGNOSIS — C4441 Basal cell carcinoma of skin of scalp and neck: Secondary | ICD-10-CM | POA: Diagnosis not present

## 2018-07-24 DIAGNOSIS — H33312 Horseshoe tear of retina without detachment, left eye: Secondary | ICD-10-CM | POA: Diagnosis not present

## 2018-07-24 DIAGNOSIS — H35373 Puckering of macula, bilateral: Secondary | ICD-10-CM | POA: Diagnosis not present

## 2018-07-24 DIAGNOSIS — D0462 Carcinoma in situ of skin of left upper limb, including shoulder: Secondary | ICD-10-CM | POA: Diagnosis not present

## 2018-07-24 DIAGNOSIS — H43813 Vitreous degeneration, bilateral: Secondary | ICD-10-CM | POA: Diagnosis not present

## 2018-08-02 DIAGNOSIS — H43813 Vitreous degeneration, bilateral: Secondary | ICD-10-CM | POA: Diagnosis not present

## 2018-08-02 DIAGNOSIS — H33312 Horseshoe tear of retina without detachment, left eye: Secondary | ICD-10-CM | POA: Diagnosis not present

## 2018-08-02 DIAGNOSIS — H35373 Puckering of macula, bilateral: Secondary | ICD-10-CM | POA: Diagnosis not present

## 2018-09-19 DIAGNOSIS — H35373 Puckering of macula, bilateral: Secondary | ICD-10-CM | POA: Diagnosis not present

## 2018-09-19 DIAGNOSIS — H3581 Retinal edema: Secondary | ICD-10-CM | POA: Diagnosis not present

## 2018-09-19 DIAGNOSIS — H35372 Puckering of macula, left eye: Secondary | ICD-10-CM | POA: Diagnosis not present

## 2018-09-20 DIAGNOSIS — H3581 Retinal edema: Secondary | ICD-10-CM | POA: Diagnosis not present

## 2018-09-20 DIAGNOSIS — H35372 Puckering of macula, left eye: Secondary | ICD-10-CM | POA: Diagnosis not present

## 2018-10-01 DIAGNOSIS — H35373 Puckering of macula, bilateral: Secondary | ICD-10-CM | POA: Diagnosis not present

## 2018-10-22 DIAGNOSIS — H3581 Retinal edema: Secondary | ICD-10-CM | POA: Diagnosis not present

## 2018-10-22 DIAGNOSIS — H35373 Puckering of macula, bilateral: Secondary | ICD-10-CM | POA: Diagnosis not present

## 2019-02-19 DIAGNOSIS — L570 Actinic keratosis: Secondary | ICD-10-CM | POA: Diagnosis not present

## 2019-02-19 DIAGNOSIS — D1801 Hemangioma of skin and subcutaneous tissue: Secondary | ICD-10-CM | POA: Diagnosis not present

## 2019-02-19 DIAGNOSIS — Z85828 Personal history of other malignant neoplasm of skin: Secondary | ICD-10-CM | POA: Diagnosis not present

## 2019-02-19 DIAGNOSIS — Z961 Presence of intraocular lens: Secondary | ICD-10-CM | POA: Diagnosis not present

## 2019-02-19 DIAGNOSIS — L814 Other melanin hyperpigmentation: Secondary | ICD-10-CM | POA: Diagnosis not present

## 2019-02-19 DIAGNOSIS — L821 Other seborrheic keratosis: Secondary | ICD-10-CM | POA: Diagnosis not present

## 2019-02-19 DIAGNOSIS — H43811 Vitreous degeneration, right eye: Secondary | ICD-10-CM | POA: Diagnosis not present

## 2019-02-26 DIAGNOSIS — H3581 Retinal edema: Secondary | ICD-10-CM | POA: Diagnosis not present

## 2019-02-26 DIAGNOSIS — H3561 Retinal hemorrhage, right eye: Secondary | ICD-10-CM | POA: Diagnosis not present

## 2019-02-26 DIAGNOSIS — H31092 Other chorioretinal scars, left eye: Secondary | ICD-10-CM | POA: Diagnosis not present

## 2019-02-26 DIAGNOSIS — H35371 Puckering of macula, right eye: Secondary | ICD-10-CM | POA: Diagnosis not present

## 2019-04-16 DIAGNOSIS — H31092 Other chorioretinal scars, left eye: Secondary | ICD-10-CM | POA: Diagnosis not present

## 2019-04-16 DIAGNOSIS — H43811 Vitreous degeneration, right eye: Secondary | ICD-10-CM | POA: Diagnosis not present

## 2019-04-16 DIAGNOSIS — H35371 Puckering of macula, right eye: Secondary | ICD-10-CM | POA: Diagnosis not present

## 2019-06-04 DIAGNOSIS — Z23 Encounter for immunization: Secondary | ICD-10-CM | POA: Diagnosis not present

## 2019-08-20 DIAGNOSIS — L570 Actinic keratosis: Secondary | ICD-10-CM | POA: Diagnosis not present

## 2019-08-20 DIAGNOSIS — Z85828 Personal history of other malignant neoplasm of skin: Secondary | ICD-10-CM | POA: Diagnosis not present

## 2019-08-20 DIAGNOSIS — L821 Other seborrheic keratosis: Secondary | ICD-10-CM | POA: Diagnosis not present

## 2019-12-31 DIAGNOSIS — F4323 Adjustment disorder with mixed anxiety and depressed mood: Secondary | ICD-10-CM | POA: Diagnosis not present

## 2020-01-14 DIAGNOSIS — F4323 Adjustment disorder with mixed anxiety and depressed mood: Secondary | ICD-10-CM | POA: Diagnosis not present

## 2020-01-28 DIAGNOSIS — F4323 Adjustment disorder with mixed anxiety and depressed mood: Secondary | ICD-10-CM | POA: Diagnosis not present

## 2020-02-11 DIAGNOSIS — F4323 Adjustment disorder with mixed anxiety and depressed mood: Secondary | ICD-10-CM | POA: Diagnosis not present

## 2020-02-18 DIAGNOSIS — C44311 Basal cell carcinoma of skin of nose: Secondary | ICD-10-CM | POA: Diagnosis not present

## 2020-02-18 DIAGNOSIS — Z85828 Personal history of other malignant neoplasm of skin: Secondary | ICD-10-CM | POA: Diagnosis not present

## 2020-02-18 DIAGNOSIS — L720 Epidermal cyst: Secondary | ICD-10-CM | POA: Diagnosis not present

## 2020-02-18 DIAGNOSIS — L570 Actinic keratosis: Secondary | ICD-10-CM | POA: Diagnosis not present

## 2020-02-25 DIAGNOSIS — F4323 Adjustment disorder with mixed anxiety and depressed mood: Secondary | ICD-10-CM | POA: Diagnosis not present

## 2020-03-10 DIAGNOSIS — F4323 Adjustment disorder with mixed anxiety and depressed mood: Secondary | ICD-10-CM | POA: Diagnosis not present

## 2020-03-17 DIAGNOSIS — C44311 Basal cell carcinoma of skin of nose: Secondary | ICD-10-CM | POA: Diagnosis not present

## 2020-03-24 DIAGNOSIS — F4323 Adjustment disorder with mixed anxiety and depressed mood: Secondary | ICD-10-CM | POA: Diagnosis not present

## 2020-03-26 DIAGNOSIS — Z20822 Contact with and (suspected) exposure to covid-19: Secondary | ICD-10-CM | POA: Diagnosis not present

## 2020-04-14 DIAGNOSIS — F4323 Adjustment disorder with mixed anxiety and depressed mood: Secondary | ICD-10-CM | POA: Diagnosis not present

## 2020-05-12 DIAGNOSIS — F4323 Adjustment disorder with mixed anxiety and depressed mood: Secondary | ICD-10-CM | POA: Diagnosis not present

## 2020-06-02 DIAGNOSIS — F4323 Adjustment disorder with mixed anxiety and depressed mood: Secondary | ICD-10-CM | POA: Diagnosis not present

## 2020-06-23 DIAGNOSIS — F4323 Adjustment disorder with mixed anxiety and depressed mood: Secondary | ICD-10-CM | POA: Diagnosis not present

## 2020-07-06 DIAGNOSIS — Z23 Encounter for immunization: Secondary | ICD-10-CM | POA: Diagnosis not present

## 2020-07-16 DIAGNOSIS — S62524A Nondisplaced fracture of distal phalanx of right thumb, initial encounter for closed fracture: Secondary | ICD-10-CM | POA: Diagnosis not present

## 2020-07-19 DIAGNOSIS — S62329A Displaced fracture of shaft of unspecified metacarpal bone, initial encounter for closed fracture: Secondary | ICD-10-CM | POA: Diagnosis not present

## 2020-07-19 DIAGNOSIS — S62511A Displaced fracture of proximal phalanx of right thumb, initial encounter for closed fracture: Secondary | ICD-10-CM | POA: Diagnosis not present

## 2020-07-20 DIAGNOSIS — S62511A Displaced fracture of proximal phalanx of right thumb, initial encounter for closed fracture: Secondary | ICD-10-CM | POA: Diagnosis not present

## 2020-07-20 DIAGNOSIS — S62231A Other displaced fracture of base of first metacarpal bone, right hand, initial encounter for closed fracture: Secondary | ICD-10-CM | POA: Diagnosis not present

## 2020-07-20 DIAGNOSIS — S62521A Displaced fracture of distal phalanx of right thumb, initial encounter for closed fracture: Secondary | ICD-10-CM | POA: Diagnosis not present

## 2020-07-20 DIAGNOSIS — Y999 Unspecified external cause status: Secondary | ICD-10-CM | POA: Diagnosis not present

## 2020-07-20 DIAGNOSIS — X58XXXA Exposure to other specified factors, initial encounter: Secondary | ICD-10-CM | POA: Diagnosis not present

## 2020-07-20 DIAGNOSIS — G8918 Other acute postprocedural pain: Secondary | ICD-10-CM | POA: Diagnosis not present

## 2020-07-30 DIAGNOSIS — M79641 Pain in right hand: Secondary | ICD-10-CM | POA: Diagnosis not present

## 2020-07-30 DIAGNOSIS — Z4789 Encounter for other orthopedic aftercare: Secondary | ICD-10-CM | POA: Diagnosis not present

## 2020-08-18 DIAGNOSIS — Z4789 Encounter for other orthopedic aftercare: Secondary | ICD-10-CM | POA: Diagnosis not present

## 2020-08-18 DIAGNOSIS — M79641 Pain in right hand: Secondary | ICD-10-CM | POA: Diagnosis not present

## 2020-08-25 DIAGNOSIS — C4441 Basal cell carcinoma of skin of scalp and neck: Secondary | ICD-10-CM | POA: Diagnosis not present

## 2020-08-25 DIAGNOSIS — D225 Melanocytic nevi of trunk: Secondary | ICD-10-CM | POA: Diagnosis not present

## 2020-08-25 DIAGNOSIS — F4323 Adjustment disorder with mixed anxiety and depressed mood: Secondary | ICD-10-CM | POA: Diagnosis not present

## 2020-08-25 DIAGNOSIS — L570 Actinic keratosis: Secondary | ICD-10-CM | POA: Diagnosis not present

## 2020-08-25 DIAGNOSIS — Z85828 Personal history of other malignant neoplasm of skin: Secondary | ICD-10-CM | POA: Diagnosis not present

## 2020-09-01 DIAGNOSIS — M79641 Pain in right hand: Secondary | ICD-10-CM | POA: Diagnosis not present

## 2020-09-08 DIAGNOSIS — M25641 Stiffness of right hand, not elsewhere classified: Secondary | ICD-10-CM | POA: Diagnosis not present

## 2020-09-15 DIAGNOSIS — M25641 Stiffness of right hand, not elsewhere classified: Secondary | ICD-10-CM | POA: Diagnosis not present

## 2020-09-22 DIAGNOSIS — F4323 Adjustment disorder with mixed anxiety and depressed mood: Secondary | ICD-10-CM | POA: Diagnosis not present

## 2020-09-22 DIAGNOSIS — M79641 Pain in right hand: Secondary | ICD-10-CM | POA: Diagnosis not present

## 2020-09-29 DIAGNOSIS — M25641 Stiffness of right hand, not elsewhere classified: Secondary | ICD-10-CM | POA: Diagnosis not present

## 2020-10-13 DIAGNOSIS — F4323 Adjustment disorder with mixed anxiety and depressed mood: Secondary | ICD-10-CM | POA: Diagnosis not present

## 2020-11-03 DIAGNOSIS — F4323 Adjustment disorder with mixed anxiety and depressed mood: Secondary | ICD-10-CM | POA: Diagnosis not present

## 2020-12-22 DIAGNOSIS — F4323 Adjustment disorder with mixed anxiety and depressed mood: Secondary | ICD-10-CM | POA: Diagnosis not present

## 2021-01-26 DIAGNOSIS — F4323 Adjustment disorder with mixed anxiety and depressed mood: Secondary | ICD-10-CM | POA: Diagnosis not present

## 2021-03-02 DIAGNOSIS — D045 Carcinoma in situ of skin of trunk: Secondary | ICD-10-CM | POA: Diagnosis not present

## 2021-03-02 DIAGNOSIS — D485 Neoplasm of uncertain behavior of skin: Secondary | ICD-10-CM | POA: Diagnosis not present

## 2021-03-02 DIAGNOSIS — D2372 Other benign neoplasm of skin of left lower limb, including hip: Secondary | ICD-10-CM | POA: Diagnosis not present

## 2021-03-02 DIAGNOSIS — D0439 Carcinoma in situ of skin of other parts of face: Secondary | ICD-10-CM | POA: Diagnosis not present

## 2021-03-02 DIAGNOSIS — D225 Melanocytic nevi of trunk: Secondary | ICD-10-CM | POA: Diagnosis not present

## 2021-03-02 DIAGNOSIS — Z85828 Personal history of other malignant neoplasm of skin: Secondary | ICD-10-CM | POA: Diagnosis not present

## 2021-03-02 DIAGNOSIS — L570 Actinic keratosis: Secondary | ICD-10-CM | POA: Diagnosis not present

## 2021-06-06 DIAGNOSIS — Z23 Encounter for immunization: Secondary | ICD-10-CM | POA: Diagnosis not present

## 2021-09-07 DIAGNOSIS — L821 Other seborrheic keratosis: Secondary | ICD-10-CM | POA: Diagnosis not present

## 2021-09-07 DIAGNOSIS — D225 Melanocytic nevi of trunk: Secondary | ICD-10-CM | POA: Diagnosis not present

## 2021-09-07 DIAGNOSIS — Z85828 Personal history of other malignant neoplasm of skin: Secondary | ICD-10-CM | POA: Diagnosis not present

## 2021-09-07 DIAGNOSIS — L814 Other melanin hyperpigmentation: Secondary | ICD-10-CM | POA: Diagnosis not present

## 2022-03-15 DIAGNOSIS — L821 Other seborrheic keratosis: Secondary | ICD-10-CM | POA: Diagnosis not present

## 2022-03-15 DIAGNOSIS — C4441 Basal cell carcinoma of skin of scalp and neck: Secondary | ICD-10-CM | POA: Diagnosis not present

## 2022-03-15 DIAGNOSIS — D225 Melanocytic nevi of trunk: Secondary | ICD-10-CM | POA: Diagnosis not present

## 2022-03-15 DIAGNOSIS — L820 Inflamed seborrheic keratosis: Secondary | ICD-10-CM | POA: Diagnosis not present

## 2022-03-15 DIAGNOSIS — Z85828 Personal history of other malignant neoplasm of skin: Secondary | ICD-10-CM | POA: Diagnosis not present

## 2022-03-15 DIAGNOSIS — L570 Actinic keratosis: Secondary | ICD-10-CM | POA: Diagnosis not present

## 2022-03-28 DIAGNOSIS — C4441 Basal cell carcinoma of skin of scalp and neck: Secondary | ICD-10-CM | POA: Diagnosis not present

## 2022-05-16 DIAGNOSIS — Z23 Encounter for immunization: Secondary | ICD-10-CM | POA: Diagnosis not present

## 2023-09-27 ENCOUNTER — Other Ambulatory Visit (HOSPITAL_BASED_OUTPATIENT_CLINIC_OR_DEPARTMENT_OTHER): Payer: Self-pay

## 2023-09-27 MED ORDER — METHYLPHENIDATE HCL ER (CD) 40 MG PO CPCR
40.0000 mg | ORAL_CAPSULE | Freq: Every day | ORAL | 0 refills | Status: AC
  Filled 2023-09-27 – 2023-10-01 (×3): qty 30, 30d supply, fill #0

## 2023-09-29 ENCOUNTER — Other Ambulatory Visit (HOSPITAL_BASED_OUTPATIENT_CLINIC_OR_DEPARTMENT_OTHER): Payer: Self-pay

## 2023-10-01 ENCOUNTER — Other Ambulatory Visit: Payer: Self-pay

## 2023-10-01 ENCOUNTER — Other Ambulatory Visit (HOSPITAL_BASED_OUTPATIENT_CLINIC_OR_DEPARTMENT_OTHER): Payer: Self-pay

## 2023-10-02 ENCOUNTER — Other Ambulatory Visit: Payer: Self-pay

## 2023-10-12 ENCOUNTER — Other Ambulatory Visit (HOSPITAL_BASED_OUTPATIENT_CLINIC_OR_DEPARTMENT_OTHER): Payer: Self-pay
